# Patient Record
Sex: Male | Born: 1935 | Race: White | Hispanic: No | Marital: Married | Smoking: Current some day smoker
Health system: Southern US, Community
[De-identification: ages and names within clinical notes are randomized; demographics above are authoritative.]

## PROBLEM LIST (undated history)

## (undated) DIAGNOSIS — I6200 Nontraumatic subdural hemorrhage, unspecified: Secondary | ICD-10-CM

## (undated) DIAGNOSIS — M1990 Unspecified osteoarthritis, unspecified site: Secondary | ICD-10-CM

## (undated) DIAGNOSIS — K5790 Diverticulosis of intestine, part unspecified, without perforation or abscess without bleeding: Secondary | ICD-10-CM

## (undated) DIAGNOSIS — Z9889 Other specified postprocedural states: Secondary | ICD-10-CM

## (undated) DIAGNOSIS — IMO0002 Reserved for concepts with insufficient information to code with codable children: Secondary | ICD-10-CM

## (undated) DIAGNOSIS — E785 Hyperlipidemia, unspecified: Secondary | ICD-10-CM

## (undated) DIAGNOSIS — R400 Somnolence: Secondary | ICD-10-CM

## (undated) DIAGNOSIS — J449 Chronic obstructive pulmonary disease, unspecified: Secondary | ICD-10-CM

## (undated) DIAGNOSIS — H9190 Unspecified hearing loss, unspecified ear: Secondary | ICD-10-CM

## (undated) DIAGNOSIS — G2581 Restless legs syndrome: Secondary | ICD-10-CM

## (undated) HISTORY — DX: Restless legs syndrome: G25.81

## (undated) HISTORY — DX: Somnolence: R40.0

## (undated) HISTORY — PX: PARTIAL GASTRECTOMY: SHX2172

## (undated) HISTORY — DX: Chronic obstructive pulmonary disease, unspecified: J44.9

## (undated) HISTORY — DX: Other specified postprocedural states: Z98.890

## (undated) HISTORY — DX: Reserved for concepts with insufficient information to code with codable children: IMO0002

## (undated) HISTORY — PX: BRAIN SURGERY: SHX531

## (undated) HISTORY — PX: HERNIA REPAIR: SHX51

## (undated) HISTORY — PX: EYE SURGERY: SHX253

---

## 1994-12-24 DIAGNOSIS — Z9889 Other specified postprocedural states: Secondary | ICD-10-CM

## 1994-12-24 HISTORY — DX: Other specified postprocedural states: Z98.890

## 1994-12-24 HISTORY — PX: CARDIAC CATHETERIZATION: SHX172

## 1997-12-24 HISTORY — PX: BACK SURGERY: SHX140

## 2000-08-28 ENCOUNTER — Encounter: Payer: Self-pay | Admitting: *Deleted

## 2000-08-28 ENCOUNTER — Encounter: Admission: RE | Admit: 2000-08-28 | Discharge: 2000-08-28 | Payer: Self-pay | Admitting: *Deleted

## 2001-09-17 ENCOUNTER — Encounter: Admission: RE | Admit: 2001-09-17 | Discharge: 2001-09-17 | Payer: Self-pay | Admitting: Internal Medicine

## 2001-09-17 ENCOUNTER — Encounter (HOSPITAL_BASED_OUTPATIENT_CLINIC_OR_DEPARTMENT_OTHER): Payer: Self-pay | Admitting: Internal Medicine

## 2003-03-30 ENCOUNTER — Encounter: Payer: Self-pay | Admitting: General Surgery

## 2003-03-30 ENCOUNTER — Encounter: Admission: RE | Admit: 2003-03-30 | Discharge: 2003-03-30 | Payer: Self-pay | Admitting: General Surgery

## 2003-03-31 ENCOUNTER — Ambulatory Visit (HOSPITAL_BASED_OUTPATIENT_CLINIC_OR_DEPARTMENT_OTHER): Admission: RE | Admit: 2003-03-31 | Discharge: 2003-03-31 | Payer: Self-pay | Admitting: General Surgery

## 2003-04-04 ENCOUNTER — Observation Stay (HOSPITAL_COMMUNITY): Admission: EM | Admit: 2003-04-04 | Discharge: 2003-04-05 | Payer: Self-pay | Admitting: Emergency Medicine

## 2003-04-04 ENCOUNTER — Encounter: Payer: Self-pay | Admitting: Internal Medicine

## 2007-09-09 ENCOUNTER — Emergency Department (HOSPITAL_COMMUNITY): Admission: EM | Admit: 2007-09-09 | Discharge: 2007-09-09 | Payer: Self-pay | Admitting: Emergency Medicine

## 2008-12-19 ENCOUNTER — Observation Stay (HOSPITAL_COMMUNITY): Admission: EM | Admit: 2008-12-19 | Discharge: 2008-12-20 | Payer: Self-pay | Admitting: Emergency Medicine

## 2008-12-19 ENCOUNTER — Ambulatory Visit: Payer: Self-pay | Admitting: Cardiology

## 2008-12-20 ENCOUNTER — Encounter (INDEPENDENT_AMBULATORY_CARE_PROVIDER_SITE_OTHER): Payer: Self-pay | Admitting: General Surgery

## 2008-12-28 ENCOUNTER — Ambulatory Visit: Payer: Self-pay

## 2009-01-04 ENCOUNTER — Ambulatory Visit: Payer: Self-pay | Admitting: Cardiology

## 2009-01-05 DIAGNOSIS — I4892 Unspecified atrial flutter: Secondary | ICD-10-CM | POA: Insufficient documentation

## 2009-01-05 DIAGNOSIS — J449 Chronic obstructive pulmonary disease, unspecified: Secondary | ICD-10-CM

## 2009-01-05 DIAGNOSIS — J4489 Other specified chronic obstructive pulmonary disease: Secondary | ICD-10-CM | POA: Insufficient documentation

## 2009-01-05 DIAGNOSIS — R079 Chest pain, unspecified: Secondary | ICD-10-CM

## 2009-01-05 DIAGNOSIS — I517 Cardiomegaly: Secondary | ICD-10-CM

## 2009-11-13 ENCOUNTER — Inpatient Hospital Stay (HOSPITAL_COMMUNITY): Admission: EM | Admit: 2009-11-13 | Discharge: 2009-11-17 | Payer: Self-pay | Admitting: Neurology

## 2009-11-13 ENCOUNTER — Ambulatory Visit: Payer: Self-pay | Admitting: Cardiovascular Disease

## 2009-11-16 ENCOUNTER — Encounter: Payer: Self-pay | Admitting: Cardiovascular Disease

## 2009-11-21 ENCOUNTER — Ambulatory Visit: Payer: Self-pay | Admitting: Cardiology

## 2009-11-21 ENCOUNTER — Encounter (INDEPENDENT_AMBULATORY_CARE_PROVIDER_SITE_OTHER): Payer: Self-pay | Admitting: Cardiology

## 2009-11-21 LAB — CONVERTED CEMR LAB: POC INR: 3.5

## 2009-11-28 ENCOUNTER — Ambulatory Visit: Payer: Self-pay | Admitting: Cardiology

## 2009-12-05 ENCOUNTER — Ambulatory Visit: Payer: Self-pay | Admitting: Cardiovascular Disease

## 2009-12-09 ENCOUNTER — Ambulatory Visit: Payer: Self-pay | Admitting: Cardiology

## 2009-12-13 ENCOUNTER — Ambulatory Visit: Payer: Self-pay | Admitting: Internal Medicine

## 2009-12-19 ENCOUNTER — Telehealth (INDEPENDENT_AMBULATORY_CARE_PROVIDER_SITE_OTHER): Payer: Self-pay | Admitting: Physician Assistant

## 2009-12-21 ENCOUNTER — Telehealth: Payer: Self-pay | Admitting: Cardiology

## 2009-12-22 ENCOUNTER — Ambulatory Visit: Payer: Self-pay | Admitting: Internal Medicine

## 2010-01-06 ENCOUNTER — Encounter: Payer: Self-pay | Admitting: Cardiology

## 2010-01-09 ENCOUNTER — Ambulatory Visit: Payer: Self-pay | Admitting: Cardiology

## 2010-01-10 ENCOUNTER — Ambulatory Visit: Payer: Self-pay | Admitting: Cardiology

## 2010-01-30 ENCOUNTER — Ambulatory Visit: Payer: Self-pay | Admitting: Cardiovascular Disease

## 2010-01-30 LAB — CONVERTED CEMR LAB: POC INR: 2.7

## 2010-02-27 ENCOUNTER — Ambulatory Visit: Payer: Self-pay | Admitting: Internal Medicine

## 2010-03-27 ENCOUNTER — Ambulatory Visit: Payer: Self-pay | Admitting: Cardiovascular Disease

## 2010-03-27 LAB — CONVERTED CEMR LAB: POC INR: 3

## 2010-04-24 ENCOUNTER — Ambulatory Visit: Payer: Self-pay | Admitting: Cardiology

## 2010-05-16 ENCOUNTER — Telehealth (INDEPENDENT_AMBULATORY_CARE_PROVIDER_SITE_OTHER): Payer: Self-pay | Admitting: Pharmacist

## 2010-05-23 ENCOUNTER — Ambulatory Visit: Payer: Self-pay | Admitting: Internal Medicine

## 2010-06-15 ENCOUNTER — Telehealth: Payer: Self-pay | Admitting: Cardiology

## 2010-06-20 ENCOUNTER — Ambulatory Visit: Payer: Self-pay | Admitting: Internal Medicine

## 2010-07-11 ENCOUNTER — Ambulatory Visit: Payer: Self-pay

## 2010-07-11 ENCOUNTER — Ambulatory Visit: Payer: Self-pay | Admitting: Internal Medicine

## 2010-07-11 ENCOUNTER — Ambulatory Visit: Payer: Self-pay | Admitting: Cardiology

## 2010-07-11 DIAGNOSIS — I959 Hypotension, unspecified: Secondary | ICD-10-CM | POA: Insufficient documentation

## 2010-07-11 DIAGNOSIS — R609 Edema, unspecified: Secondary | ICD-10-CM

## 2010-07-11 LAB — CONVERTED CEMR LAB: POC INR: 2.6

## 2010-07-14 ENCOUNTER — Telehealth: Payer: Self-pay | Admitting: Cardiology

## 2010-07-21 ENCOUNTER — Telehealth: Payer: Self-pay | Admitting: Cardiology

## 2010-07-24 ENCOUNTER — Encounter (INDEPENDENT_AMBULATORY_CARE_PROVIDER_SITE_OTHER): Payer: Self-pay | Admitting: *Deleted

## 2010-07-27 ENCOUNTER — Ambulatory Visit: Payer: Self-pay | Admitting: Cardiovascular Disease

## 2010-07-27 LAB — CONVERTED CEMR LAB: POC INR: 2.5

## 2010-08-22 ENCOUNTER — Telehealth: Payer: Self-pay | Admitting: Cardiology

## 2010-08-23 ENCOUNTER — Telehealth (INDEPENDENT_AMBULATORY_CARE_PROVIDER_SITE_OTHER): Payer: Self-pay | Admitting: *Deleted

## 2010-08-30 ENCOUNTER — Ambulatory Visit: Payer: Self-pay | Admitting: Cardiovascular Disease

## 2010-08-30 LAB — CONVERTED CEMR LAB: POC INR: 4

## 2010-09-06 ENCOUNTER — Ambulatory Visit: Payer: Self-pay | Admitting: Internal Medicine

## 2010-09-06 LAB — CONVERTED CEMR LAB: POC INR: 2.6

## 2010-09-29 ENCOUNTER — Ambulatory Visit: Payer: Self-pay | Admitting: Cardiology

## 2010-09-29 LAB — CONVERTED CEMR LAB: POC INR: 3.3

## 2010-10-17 ENCOUNTER — Ambulatory Visit: Payer: Self-pay | Admitting: Cardiology

## 2010-11-02 ENCOUNTER — Ambulatory Visit: Payer: Self-pay | Admitting: Cardiology

## 2010-11-17 ENCOUNTER — Ambulatory Visit: Payer: Self-pay | Admitting: Cardiology

## 2010-12-07 ENCOUNTER — Ambulatory Visit: Payer: Self-pay | Admitting: Internal Medicine

## 2010-12-24 DIAGNOSIS — I6200 Nontraumatic subdural hemorrhage, unspecified: Secondary | ICD-10-CM

## 2010-12-24 HISTORY — PX: OTHER SURGICAL HISTORY: SHX169

## 2010-12-24 HISTORY — DX: Nontraumatic subdural hemorrhage, unspecified: I62.00

## 2010-12-28 ENCOUNTER — Ambulatory Visit
Admission: RE | Admit: 2010-12-28 | Discharge: 2010-12-28 | Payer: Self-pay | Source: Home / Self Care | Attending: Cardiology | Admitting: Cardiology

## 2010-12-28 ENCOUNTER — Ambulatory Visit: Admission: RE | Admit: 2010-12-28 | Discharge: 2010-12-28 | Payer: Self-pay | Source: Home / Self Care

## 2010-12-28 ENCOUNTER — Encounter: Payer: Self-pay | Admitting: Cardiology

## 2011-01-24 ENCOUNTER — Encounter (INDEPENDENT_AMBULATORY_CARE_PROVIDER_SITE_OTHER): Payer: Medicare PPO

## 2011-01-24 ENCOUNTER — Encounter: Payer: Self-pay | Admitting: Internal Medicine

## 2011-01-24 ENCOUNTER — Ambulatory Visit: Admit: 2011-01-24 | Payer: Self-pay

## 2011-01-24 DIAGNOSIS — I4891 Unspecified atrial fibrillation: Secondary | ICD-10-CM

## 2011-01-24 DIAGNOSIS — Z7901 Long term (current) use of anticoagulants: Secondary | ICD-10-CM

## 2011-01-25 NOTE — Medication Information (Signed)
Summary: rov/tm   Anticoagulant Therapy  Managed by: Weston Brass, PharmD Referring MD: Shirlee Latch MD, Freida Busman PCP: Jarome Matin Supervising MD: Antoine Poche MD, Fayrene Fearing Indication 1: Atrial Fibrillation Indication 2: Atrial Flutter Lab Used: [facility] [place] Site: [address] INR POC 1.6 INR RANGE 2-3  Dietary changes: no    Health status changes: no    Bleeding/hemorrhagic complications: no    Recent/future hospitalizations: no    Any changes in medication regimen? no    Recent/future dental: no  Any missed doses?: yes     Details: held Coumadin for cataract surgery on 11/1.  Missed 1 dose since procedure this week  Is patient compliant with meds? yes       Allergies: 1)  ! Penicillin 2)  ! Augmentin  Anticoagulation Management History:      The patient is taking warfarin and comes in today for a routine follow up visit.  Positive risk factors for bleeding include an age of 75 years or older.  The bleeding index is 'intermediate risk'.  Positive CHADS2 values include Age > 75 years old.  Anticoagulation responsible provider: Antoine Poche MD, Fayrene Fearing.  INR POC: 1.6.  Cuvette Lot#: [id number].  Exp: 11/2011.    Anticoagulation Management Assessment/Plan:      The patient's current anticoagulation dose is Warfarin sodium 5 mg tabs: Use as directed by Anticoagulation Clinic.  The target INR is 2.0-3.0.  The next INR is due 11/17/2010.  Anticoagulation instructions were given to patient/spouse.  Results were reviewed/authorized by Weston Brass, PharmD.  He was notified by Weston Brass PharmD.         Current Anticoagulation Instructions: INR 1.6  Take 1 tablet today and tomorrow then resume same dose of 1/2 tablet every day except 1 tablet on Monday.  Recheck INR in 2 weeks.

## 2011-01-25 NOTE — Medication Information (Signed)
Summary: ROV/LB      Allergies Added:  Anticoagulant Therapy  Managed by: Jeralene Peters, PharmD Referring MD: Shirlee Latch MD, Dalton PCP: Jarome Matin Supervising MD: Excell Seltzer MD, Casimiro Needle Indication 1: Atrial Fibrillation Indication 2: Atrial Flutter [place] Site: [address] INR POC 2.7 INR RANGE 2-3  Dietary changes: no    Health status changes: no    Bleeding/hemorrhagic complications: no    Recent/future hospitalizations: no    Any changes in medication regimen? no    Recent/future dental: no  Any missed doses?: no       Is patient compliant with meds? yes       Current Medications (verified): 1)  Diltiazem Hcl Er Beads 360 Mg Xr24h-Cap (Diltiazem Hcl Er Beads) .... Take One Capsule By Mouth Daily 2)  Warfarin Sodium 5 Mg Tabs (Warfarin Sodium) .... Use As Directed By Anticoagulation Clinic 3)  Spiriva Handihaler 18 Mcg Caps (Tiotropium Bromide Monohydrate) .... Inhale 1 Capsule Daily. 4)  Toprol Xl 25 Mg Xr24h-Tab (Metoprolol Succinate) .... One Tablet Twice A Day 5)  Omeprazole 20 Mg Cpdr (Omeprazole) .... Take One Capsule By Mouth Daily  Allergies (verified): 1)  ! Penicillin 2)  ! Augmentin  Anticoagulation Management History:      The patient is taking warfarin and comes in today for a routine follow up visit.  Positive risk factors for bleeding include an age of 75 years or older.  The bleeding index is 'intermediate risk'.  Positive CHADS2 values include Age > 1 years old.  Anticoagulation responsible provider: Excell Seltzer MD, Casimiro Needle.  INR POC: 2.7.  Cuvette Lot#: [id number].  Exp: 03/2011.    Anticoagulation Management Assessment/Plan:      The patient's current anticoagulation dose is Warfarin sodium 5 mg tabs: Use as directed by Anticoagulation Clinic.  The target INR is 2.0-3.0.  The next INR is due 02/27/2010.  Results were reviewed/authorized by Jeralene Peters, PharmD.         Prior Anticoagulation Instructions: INR 3  CONTINUE TAKING 0.5 TABLETS EVERYDAY  EXCEPT TAKE 1 TABLET ON MONDAYS AND FRIDAYS. RECHECK IN 3 WEEKS.  Current Anticoagulation Instructions: INR 2.7  Continue taking 1/2 tablet daily except  take 1 tablet  on Mondays and Fridays.  Recheck in 4 weeks.

## 2011-01-25 NOTE — Medication Information (Signed)
Summary: rov/sp   Anticoagulant Therapy  Managed by: Weston Brass, PharmD Referring MD: Shirlee Latch MD, Freida Busman PCP: Gwenette Greet MD: Myrtis Ser MD, Tinnie Gens Indication 1: Atrial Fibrillation Indication 2: Atrial Flutter Lab Used: [facility] [place] Site: [address] INR POC 2.6 INR RANGE 2-3  Dietary changes: no    Health status changes: no    Bleeding/hemorrhagic complications: no    Recent/future hospitalizations: no    Any changes in medication regimen? no    Recent/future dental: no  Any missed doses?: no       Is patient compliant with meds? yes       Allergies: 1)  ! Penicillin 2)  ! Augmentin  Anticoagulation Management History:      The patient is taking warfarin and comes in today for a routine follow up visit.  Positive risk factors for bleeding include an age of 75 years or older.  The bleeding index is 'intermediate risk'.  Positive CHADS2 values include Age > 10 years old.  Anticoagulation responsible provider: Myrtis Ser MD, Tinnie Gens.  INR POC: 2.6.  Cuvette Lot#: [id number].  Exp: 11/2011.    Anticoagulation Management Assessment/Plan:      The patient's current anticoagulation dose is Warfarin sodium 5 mg tabs: Use as directed by Anticoagulation Clinic.  The target INR is 2.0-3.0.  The next INR is due 12/08/2010.  Anticoagulation instructions were given to patient/spouse.  Results were reviewed/authorized by Weston Brass, PharmD.  He was notified by Weston Brass PharmD.         Prior Anticoagulation Instructions: INR 1.6  Take 1 tablet today and tomorrow then resume same dose of 1/2 tablet every day except 1 tablet on Monday.  Recheck INR in 2 weeks.   Current Anticoagulation Instructions: INR 2.6  Continue same dose of 1/2 tablet every day except 1 tablet on Monday.  Recheck INR in 3 weeks.

## 2011-01-25 NOTE — Progress Notes (Signed)
  Phone Note Call from Patient   Caller: Spouse Summary of Call: Pt's wife called because he accidentally took 1 tablet last PM. Normal schedule is Coumadin 1/2 tablet daily except 1 tablet on Mon and Fri. Instructed pt's wife to give him 1/2 tablet today and tomorrow, and then return to normal schedule. Pt's wife understood plan. Initial call taken by: Elaina Pattee, PharmD,  June 15, 2010 9:41 AM

## 2011-01-25 NOTE — Assessment & Plan Note (Signed)
Summary: per check out/sf      Allergies Added:   Visit Type:  Follow-up Primary Provider:  Jarome Matin  CC:  no complaints.  History of Present Illness: 77 yo with h/o COPD and atypical atrial flutter returns for followup.  In 11/10, he underwent TEE-guided DCCV successfully.  Unfortunately, he went back into atrial flutter and remains in atrial flutter today.    He has been doing well in general.  No chest pain or exertional dyspnea.  No syncope.  He works in the yard (mows grass, has a garden).  He rarely smokes a cigarette. Weight is stable.    ECG: Atypical atrial flutter with rate 72, RBBB, LAFB  Labs (11/10): creatinine 1.11, K 3.8  Current Medications (verified): 1)  Diltiazem Hcl Er Beads 180 Mg Xr24h-Cap (Diltiazem Hcl Er Beads) .... One By Mouth Daily 2)  Warfarin Sodium 5 Mg Tabs (Warfarin Sodium) .... Use As Directed By Anticoagulation Clinic 3)  Toprol Xl 25 Mg Xr24h-Tab (Metoprolol Succinate) .... One Tablet Twice A Day  Allergies (verified): 1)  ! Penicillin 2)  ! Augmentin  Past History:  Past Medical History: Reviewed history from 12/09/2009 and no changes required. 1.  Episode of syncope in 2004 thought to be vasovagal. 2. Left heart catheterization in 1996 done by Dr. [place] was negative per report.  Adenosine Myoview in January 2010:  EF 64%, normal wall motion, mild inferobasilar fixed defect that was likely attenuation, no scar or ischemia.  3.  COPD.  Quit smoking 11/10.  4.  Peptic ulcer disease. 5.  History of restless legs syndrome. 6.  Atypical atrial flutter, this is paroxysmal.  Admission in 11/10 with atrial flutter/RVR, patient underwent TEE-guided DCCV but flutter recurred by 12/10.  7.  Echo (11/10): EF 70%, moderate LVH, mild MR, no regional wall motion abnormalities.   Family History: Reviewed history from 01/05/2009 and no changes required. The patient's father has a history of stroke.  The patient's brother had coronary artery  disease.  Social History: Reviewed history from 07/11/2010 and no changes required. The patient lives in [place] with his wife.  He is retired from the [employer].  He has a greater than 60-pack-year history of smoking, quit 11/10.  He denies drinking alcohol.  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  Vital Signs:  Patient profile:   77 year old male Height:      70 inches Weight:      138.50 pounds BMI:     19.94 Pulse rate:   71 / minute BP sitting:   107 / 63  (left arm) Cuff size:   regular  Vitals Entered By: Caralee Ates CMA (December 28, 2010 3:16 PM)  Physical Exam  General:  Well developed, well nourished, in no acute distress. Neck:  Neck supple, no JVD. No masses, thyromegaly or abnormal cervical nodes. Lungs:  Clear bilaterally to auscultation and percussion. Heart:  Non-displaced PMI, chest non-tender; irregular rate and rhythm, S1, S2 without rubs or gallops. 1/6 systolic murmur along the upper sternal border.  Carotid upstroke normal, no bruit.  Pedals normal pulses. No edema.  Abdomen:  Bowel sounds positive; abdomen soft and non-tender without masses, organomegaly, or hernias noted. No hepatosplenomegaly. Extremities:  No clubbing or cyanosis. Neurologic:  Alert and oriented x 3. Psych:  Normal affect.   Impression & Recommendations:  Problem # 1:  ATRIAL FLUTTER (ICD-427.32) Atypical flutter.  This recurred post-cardioversion in 11/10.  Rate is good on current regimen.  Continue coumadin (no falls, steady  on feet) as his CHADSVASC score is 2 and he has a moderate stroke risk.  Followup in 1 year.   Patient Instructions: 1)  Your physician wants you to follow-up in: 1 year with Dr Shirlee Latch.Jake Shark 2013)  You will receive a reminder letter in the mail two months in advance. If you don't receive a letter, please call our office to schedule the follow-up appointment.

## 2011-01-25 NOTE — Medication Information (Signed)
Summary: rov/cs  Anticoagulant Therapy  Managed by: Bethena Midget, RN, BSN Referring MD: Shirlee Latch MD, Makayela Secrest PCP: Gwenette Greet MD: Shirlee Latch MD, Nickalos Petersen Indication 1: Atrial Fibrillation Indication 2: Atrial Flutter Lab Used: [facility] [place] Site: [address] INR POC 3.1 INR RANGE 2-3  Dietary changes: no    Health status changes: no    Bleeding/hemorrhagic complications: no    Recent/future hospitalizations: yes       Details: Pending Left eye cataract removal on 10/24/10  Any changes in medication regimen? no    Recent/future dental: no  Any missed doses?: no       Is patient compliant with meds? yes       Allergies: 1)  ! Penicillin 2)  ! Augmentin  Anticoagulation Management History:      The patient is taking warfarin and comes in today for a routine follow up visit.  Positive risk factors for bleeding include an age of 75 years or older.  The bleeding index is 'intermediate risk'.  Positive CHADS2 values include Age > 6 years old.  Anticoagulation responsible provider: Shirlee Latch MD, Melecio Cueto.  INR POC: 3.1.  Cuvette Lot#: [id number].  Exp: 11/2011.    Anticoagulation Management Assessment/Plan:      The patient's current anticoagulation dose is Warfarin sodium 5 mg tabs: Use as directed by Anticoagulation Clinic.  The target INR is 2.0-3.0.  The next INR is due 11/02/2010.  Anticoagulation instructions were given to patient/spouse.  Results were reviewed/authorized by Bethena Midget, RN, BSN.  He was notified by Bethena Midget, RN, BSN.         Prior Anticoagulation Instructions: INR 3.3  Take 1/2 tablet today. Then take 1/2 tablet every day of the week, except 1 tablet on Monday.  Return to clinic on Tuesday, October 17, 2010.    Current Anticoagulation Instructions: INR 3.1 Take last dose this Friday  Oct 28th per Pharm D.  Then resume after procedure per Dr Vic Blackbird instructions. Resume 1/2 pill everyday except 1 pill on Mondays.    Appended Document: rov/cs Per Dr Shirlee Latch pt is ok to hold coumadin for 3 days prior to Cataract removal.

## 2011-01-25 NOTE — Progress Notes (Signed)
Summary: B/P readings   Phone Note Outgoing Call   Call placed by: Katina Dung, RN, BSN,  July 21, 2010 2:08 PM Call placed to: Patient Summary of Call: B/P readings  Follow-up for Phone Call        talked with pt--left message for wife  to call me back with B/P readings-Anne Lankford,RN  talked with wife 07/12/10  94/51 07/13/10  97/58 07/14/10  88/51 07/15/10 98/62 07/16/10 92/54 07/17/10  07/18/10 90/57 07/19/10 104/61 07/20/10 102/62 07/21/10 117/67  wife states pt feels good--no dizziness/no lightheadedness--cataract surgery 07/18/10--she will continue to monitor pt's B/P --will forward to Dr Shirlee Latch for review       Appended Document: B/P readings Decrease diltiazem CD to 180 mg daily  Appended Document: B/P readings wife aware.  new doseage called in

## 2011-01-25 NOTE — Medication Information (Signed)
Summary: rov/kb  Anticoagulant Therapy  Managed by: Weston Brass, PharmD Referring MD: Shirlee Latch MD, Freida Busman PCP: Jarome Matin Supervising MD: Gala Romney MD, Reuel Boom Indication 1: Atrial Fibrillation Indication 2: Atrial Flutter Lab Used: [facility] [place] Site: [address] INR POC 3.0 INR RANGE 2-3  Dietary changes: no    Health status changes: no    Bleeding/hemorrhagic complications: no    Recent/future hospitalizations: no    Any changes in medication regimen? no    Recent/future dental: no  Any missed doses?: yes     Details: missed 1 dose but took extra 1/2 tablet the next day  Is patient compliant with meds? yes       Allergies: 1)  ! Penicillin 2)  ! Augmentin  Anticoagulation Management History:      The patient is taking warfarin and comes in today for a routine follow up visit.  Positive risk factors for bleeding include an age of 75 years or older.  The bleeding index is 'intermediate risk'.  Positive CHADS2 values include Age > 68 years old.  Anticoagulation responsible provider: Ludmilla Mcgillis MD, Reuel Boom.  INR POC: 3.0.  Cuvette Lot#: [id number].  Exp: 07/2011.    Anticoagulation Management Assessment/Plan:      The patient's current anticoagulation dose is Warfarin sodium 5 mg tabs: Use as directed by Anticoagulation Clinic.  The target INR is 2.0-3.0.  The next INR is due 07/18/2010.  Anticoagulation instructions were given to patient.  Results were reviewed/authorized by Weston Brass, PharmD.  He was notified by Weston Brass PharmD.         Prior Anticoagulation Instructions: INR-2.9 Resume normal dosing schedule. Take 1 tablet on Monday and Fridayand take half a tablet on all other days. Return in 4 weeks  Current Anticoagulation Instructions: INR 3.0  Continue same dose of 1/2 tablet every day except 1 tablet on Monday and Friday.

## 2011-01-25 NOTE — Medication Information (Signed)
Summary: rov/jm   Anticoagulant Therapy  Managed by: Weston Brass, PharmD Referring MD: Shirlee Latch MD, Freida Busman PCP: Jarome Matin Supervising MD: Johney Frame MD, Fayrene Fearing Indication 1: Atrial Fibrillation Indication 2: Atrial Flutter Lab Used: [facility] [place] Site: [address] INR POC 2.6 INR RANGE 2-3  Dietary changes: no    Health status changes: no    Bleeding/hemorrhagic complications: no    Recent/future hospitalizations: no    Any changes in medication regimen? no    Recent/future dental: no  Any missed doses?: no       Is patient compliant with meds? yes       Allergies: 1)  ! Penicillin 2)  ! Augmentin  Anticoagulation Management History:      The patient is taking warfarin and comes in today for a routine follow up visit.  Positive risk factors for bleeding include an age of 75 years or older.  The bleeding index is 'intermediate risk'.  Positive CHADS2 values include Age > 75 years old.  Anticoagulation responsible provider: Allred MD, Fayrene Fearing.  INR POC: 2.6.  Cuvette Lot#: [id number].  Exp: 09/2011.    Anticoagulation Management Assessment/Plan:      The patient's current anticoagulation dose is Warfarin sodium 5 mg tabs: Use as directed by Anticoagulation Clinic.  The target INR is 2.0-3.0.  The next INR is due 09/29/2010.  Anticoagulation instructions were given to patient.  Results were reviewed/authorized by Weston Brass, PharmD.  He was notified by Harrel Carina, PharmD candidate.         Prior Anticoagulation Instructions: INR 4.0  Do not take today's dose of Coumadin.  Take one-half tablet tomorrow (Thursday), one-half tablet on Friday, then resume one-half tablet every day except one tablet on Monday and Friday.  We will see you in 1 week.    Current Anticoagulation Instructions: INR 2.6  Continue taking 1/2 tablet everyday except take 1 tablet on Mondays and Fridays. Re-check INR in 3 weeks.

## 2011-01-25 NOTE — Progress Notes (Signed)
  Phone Note Outgoing Call   Call placed by: Bethena Midget, RN, BSN,  August 23, 2010 2:35 PM Details for Reason: Follow up  Summary of Call: Telephoned spouse and she states that Dr Shirlee Latch called them this AM. After Dr Shirlee Latch talked to the pt, spouse states he agreed to take coumadin.  Reminder her of his follow up with [place] next week.  Initial call taken by: Bethena Midget, RN, BSN,  August 23, 2010 2:46 PM

## 2011-01-25 NOTE — Medication Information (Signed)
Summary: rov/sp  Anticoagulant Therapy  Managed by: Weston Brass, PharmD Referring MD: Shirlee Latch MD, Freida Busman PCP: Gwenette Greet MD: Graciela Husbands MD, Viviann Spare Indication 1: Atrial Fibrillation Indication 2: Atrial Flutter Lab Used: [facility] [place] Site: [address] INR POC 2.6 INR RANGE 2-3  Dietary changes: no    Health status changes: no    Bleeding/hemorrhagic complications: no    Recent/future hospitalizations: yes       Details: going to eye doctor tomorrow   Any changes in medication regimen? no    Recent/future dental: no  Any missed doses?: no       Is patient compliant with meds? yes       Allergies: 1)  ! Penicillin 2)  ! Augmentin  Anticoagulation Management History:      The patient is taking warfarin and comes in today for a routine follow up visit.  Positive risk factors for bleeding include an age of 75 years or older.  The bleeding index is 'intermediate risk'.  Positive CHADS2 values include Age > 75 years old.  Anticoagulation responsible provider: Graciela Husbands MD, Viviann Spare.  INR POC: 2.6.  Cuvette Lot#: [id number].  Exp: 09/2011.    Anticoagulation Management Assessment/Plan:      The patient's current anticoagulation dose is Warfarin sodium 5 mg tabs: Use as directed by Anticoagulation Clinic.  The target INR is 2.0-3.0.  The next INR is due 08/08/2010.  Anticoagulation instructions were given to patient.  Results were reviewed/authorized by Weston Brass, PharmD.  He was notified by Dillard Cannon.         Prior Anticoagulation Instructions: INR 3.0  Continue same dose of 1/2 tablet every day except 1 tablet on Monday and Friday.   Current Anticoagulation Instructions: INR 2.6  Continue same dose of 1/2 tablet daily except 1 tablet on Monday and Friday.  Re-check INR in 4 weeks.

## 2011-01-25 NOTE — Medication Information (Signed)
Summary: rov/ewj  Anticoagulant Therapy  Managed by: Eda Keys, PharmD Referring MD: Shirlee Latch MD, Freida Busman PCP: Gwenette Greet MD: Clifton James MD, Cristal Deer Indication 1: Atrial Fibrillation Indication 2: Atrial Flutter Lab Used: [facility] [place] Site: [address] INR POC 4.0 INR RANGE 2-3  Dietary changes: no    Health status changes: no    Bleeding/hemorrhagic complications: no    Recent/future hospitalizations: no    Any changes in medication regimen? no    Recent/future dental: no  Any missed doses?: no       Is patient compliant with meds? yes       Allergies: 1)  ! Penicillin 2)  ! Augmentin  Anticoagulation Management History:      The patient is taking warfarin and comes in today for a routine follow up visit.  Positive risk factors for bleeding include an age of 75 years or older.  The bleeding index is 'intermediate risk'.  Positive CHADS2 values include Age > 87 years old.  Anticoagulation responsible provider: Clifton James MD, Cristal Deer.  INR POC: 4.0.  Cuvette Lot#: [id number].  Exp: 09/2011.    Anticoagulation Management Assessment/Plan:      The patient's current anticoagulation dose is Warfarin sodium 5 mg tabs: Use as directed by Anticoagulation Clinic.  The target INR is 2.0-3.0.  The next INR is due 09/06/2010.  Anticoagulation instructions were given to patient.  Results were reviewed/authorized by Eda Keys, PharmD.  He was notified by Kennieth Francois.         Prior Anticoagulation Instructions: INR 2.5  Continue on same dosage 1/2 tablet daily except 1 tablet on Mondays and Fridays.  Recheck in 4 weeks.    Current Anticoagulation Instructions: INR 4.0  Do not take today's dose of Coumadin.  Take one-half tablet tomorrow (Thursday), one-half tablet on Friday, then resume one-half tablet every day except one tablet on Monday and Friday.  We will see you in 1 week.

## 2011-01-25 NOTE — Progress Notes (Signed)
Summary: Refusing to take Coumadin  Phone Note Call from Patient   Caller: Spouse Call For: Coumadin Clinic Summary of Call: Pt's wife called states pt refused to take his Coumadin last night and states "he is tired of taking all of these pills" and he is no longer going to take his Coumadin.  Explained the importance of taking Coumadin and the incr risks associated with blood clots, ie stroke, MI, PE, DVT, and death if pt does not take the Coumadin.  Pt's wife states she understands risks and will try to persude her husband to resume Coumadin.  Advised to call back if pt refuses to resume so we can let his cardiologist know he has discontinued.  She agrees.  Pt's current Coumadin dosage is 1/2 tablet daily except 1 tablet on Mondays and Fridays.  Advised to have pt take 1 tablet today if he decides to resume, then continue as previously rx.  Initial call taken by: Cloyde Reams RN,  August 22, 2010 12:07 PM     Appended Document: Refusing to take Coumadin Would prefer he take coumadin if possible due to stroke risk with persistent atrial flutter.  Pradaxa would be an option as well though I tend to prefer coumadin in more elderly patients (reversible).  If he refuses to take either, please make sure that he will take ASA 325 mg daily.

## 2011-01-25 NOTE — Medication Information (Signed)
Summary: rov/eac  Anticoagulant Therapy  Managed by: Weston Brass, PharmD Referring MD: Shirlee Latch MD, Freida Busman PCP: Gwenette Greet MD: Ladona Ridgel MD, Sharlot Gowda Indication 1: Atrial Fibrillation Indication 2: Atrial Flutter Lab Used: [facility] [place] Site: [address] INR POC 2.9 INR RANGE 2-3  Dietary changes: no    Health status changes: no    Bleeding/hemorrhagic complications: no    Recent/future hospitalizations: no    Any changes in medication regimen? no    Recent/future dental: no  Any missed doses?: yes     Details: 5.23..11 missed dose and then took a whole tablet next day instead of one tablet  Is patient compliant with meds? yes       Allergies: 1)  ! Penicillin 2)  ! Augmentin  Anticoagulation Management History:      The patient is taking warfarin and comes in today for a routine follow up visit.  Positive risk factors for bleeding include an age of 4 years or older.  The bleeding index is 'intermediate risk'.  Positive CHADS2 values include Age > 9 years old.  Anticoagulation responsible provider: Ladona Ridgel MD, Sharlot Gowda.  INR POC: 2.9.  Cuvette Lot#: [id number].  Exp: 07/2011.    Anticoagulation Management Assessment/Plan:      The patient's current anticoagulation dose is Warfarin sodium 5 mg tabs: Use as directed by Anticoagulation Clinic.  The target INR is 2.0-3.0.  The next INR is due 06/20/2010.  Anticoagulation instructions were given to patient.  Results were reviewed/authorized by Weston Brass, PharmD.  He was notified by [facility].         Prior Anticoagulation Instructions: INR 2.8  Continue taking 1 tablet on MOnday and Friday and 1/2 tablet all other days.  Return to clinic in 4 weeks.    Current Anticoagulation Instructions: INR-2.9 Resume normal dosing schedule. Take 1 tablet on Monday and Fridayand take half a tablet on all other days. Return in 4 weeks

## 2011-01-25 NOTE — Miscellaneous (Signed)
Summary: decrease Ditiazem 180mg   Clinical Lists Changes  Medications: Changed medication from DILTIAZEM HCL CR 240 MG XR24H-CAP (DILTIAZEM HCL) one daily to DILTIAZEM HCL ER BEADS 180 MG XR24H-CAP (DILTIAZEM HCL ER BEADS) one by mouth daily - Signed Rx of DILTIAZEM HCL ER BEADS 180 MG XR24H-CAP (DILTIAZEM HCL ER BEADS) one by mouth daily;  #30 x 11;  Signed;  Entered by: Dennis Bast, RN, BSN;  Authorized by: Marca Ancona, MD;  Method used: [facility] [address] [phone]*, [address] [place], [place], [place]  [address], Ph: [phone] or [phone], Fax: [phone]    Prescriptions: DILTIAZEM HCL ER BEADS 180 MG XR24H-CAP (DILTIAZEM HCL ER BEADS) one by mouth daily  #30 x 11   Entered by:   Dennis Bast, RN, BSN   Authorized by:   Marca Ancona, MD   Signed by:   Dennis Bast, RN, BSN on 07/24/2010   Method used:   [facility]ronically to        [facility] [address] [phone]* (retail)       [address]       [place], [place]  [address]       Ph: [phone] or [phone]       Fax: [phone]   RxID:   [id number]

## 2011-01-25 NOTE — Progress Notes (Signed)
Summary: catarac surgery 7-26/**PH rings no answer/nm   Phone Note Call from Patient   Caller: Patient Reason for Call: Talk to Nurse Summary of Call: pt's catarac surgery scheduled for 7-26-was here 7-19 was told there should be no problem with having the surgery, but wife just wanted to confirm that since they didn't see the eye dr until the day after he was seen here and they didn't know if he would actually have surgery or that it would be so soon if any problem can call [phone] Initial call taken by: Glynda Jaeger,  July 14, 2010 4:32 PM  Follow-up for Phone Call        Attemted to call pt. phone ringed several time no answer. Ollen Gross, RN, BSN  July 14, 2010 5:02 PM      Appended Document: catarac surgery 7-26/**PH rings no answer/nm ok to stop coumadin for cataract surgery (5 days prior) and restart after.  Appended Document: catarac surgery 7-26/**PH rings no answer/nm PT AWARE  COUMADIN HELD X3 DAYS  PRIOR TO PROCEDURE  MD PERFORMING   ONLY REQUESTED 3 DAYS AND TO RESTART PER THEIR INSTRUCTION./CY

## 2011-01-25 NOTE — Assessment & Plan Note (Signed)
Summary: 1 mo f/u      Allergies Added:   Primary Provider:  Jarome Matin  CC:  1 month follow up. Pt has questions on how long he will have to be on Coumadin.   His pulse rates have been between 52-66 at home per Anne's request for him to check it.Trevor Brooks  History of Present Illness: 77 Brooks with h/o COPD and atypical atrial flutter returns for followup after recent hospitalization with atrial flutter and rapid ventricular response in 11/10.  While in the hospital, he underwent TEE-guided DCCV successfully.  Unfortunately, he went back into atrial flutter.    He has atypical atrial flutter today with rate 70.  His wife has been checking BP and pulse at home and says his pulse has run 52-66.  He has been doing well in general.  No chest pain or exertional dyspnea.  No syncope.  He has not been as active as usual due to the weather.  He is maintaining abstinence from cigarettes.   ECG: Atypical atrial flutter with rate 68, RBBB  Labs (11/10): creatinine 1.11, K 3.8  Current Medications (verified): 1)  Diltiazem Hcl Er Beads 360 Mg Xr24h-Cap (Diltiazem Hcl Er Beads) .... Take One Capsule By Mouth Daily 2)  Warfarin Sodium 5 Mg Tabs (Warfarin Sodium) .... Use As Directed By Anticoagulation Clinic 3)  Spiriva Handihaler 18 Mcg Caps (Tiotropium Bromide Monohydrate) .... Inhale 1 Capsule Daily. 4)  Toprol Xl 25 Mg Xr24h-Tab (Metoprolol Succinate) .... One Tablet Twice A Day 5)  Omeprazole 20 Mg Cpdr (Omeprazole) .... Take One Capsule By Mouth Daily  Allergies (verified): 1)  ! Penicillin 2)  ! Augmentin  Past History:  Past Medical History: Last updated: 12/09/2009 1.  Episode of syncope in 2004 thought to be vasovagal. 2. Left heart catheterization in 1996 done by Dr. Swaziland was negative per report.  Adenosine Myoview in January 2010:  EF 64%, normal wall motion, mild inferobasilar fixed defect that was likely attenuation, no scar or ischemia.  3.  COPD.  Quit smoking 11/10.  4.  Peptic  ulcer disease. 5.  History of restless legs syndrome. 6.  Atypical atrial flutter, this is paroxysmal.  Admission in 11/10 with atrial flutter/RVR, patient underwent TEE-guided DCCV but flutter recurred by 12/10.  7.  Echo (11/10): EF 70%, moderate LVH, mild MR, no regional wall motion abnormalities.   Family History: Reviewed history from 01/05/2009 and no changes required. The patient's father has a history of stroke.  The patient's brother had coronary artery disease.  Social History: Reviewed history from 12/09/2009 and no changes required. The patient lives in the [place] with his wife.  He is retired from the [employer].  He has a greater than 60-pack-year history of smoking, quit 11/10.  He denies drinking alcohol.  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  Review of Systems       All systems reviewed and negative except as per HPI.   Vital Signs:  Patient profile:   77 year old male Height:      70 inches Weight:      143 pounds Pulse rate:   70 / minute Pulse rhythm:   regular BP sitting:   126 / Trevor  (right arm) Cuff size:   regular  Vitals Entered By: Judithe Modest CMA (January 10, 2010 11:54 AM)  Physical Exam  General:  Well developed, well nourished, in no acute distress. Neck:  Neck supple, no JVD. No masses, thyromegaly or abnormal cervical nodes. Lungs:  Clear bilaterally  to auscultation and percussion. Heart:  Non-displaced PMI, chest non-tender; irregular rate and rhythm, S1, S2 without murmurs, rubs or gallops. Carotid upstroke normal, no bruit.  Pedals normal pulses. No edema, no varicosities. Abdomen:  Bowel sounds positive; abdomen soft and non-tender without masses, organomegaly, or hernias noted. No hepatosplenomegaly. Extremities:  No clubbing or cyanosis. Neurologic:  Alert and oriented x 3. Psych:  Normal affect.   Impression & Recommendations:  Problem # 1:  ATRIAL FLUTTER (ICD-427.32) Atypical flutter.  This has recurred post-cardioversion in 11/10.  Rate  is good on current regimen of Toprol XL and diltiazem CD which I will continue.  Continue coumadin (no falls, steady on feet) as his CHADSVASC score is 2 and he has a moderate stroke risk.   Patient Instructions: 1)  Your physician wants you to follow-up in: 6 months with Dr Marca Ancona   You will receive a reminder letter in the mail two months in advance. If you don't receive a letter, please call our office to schedule the follow-up appointment.

## 2011-01-25 NOTE — Assessment & Plan Note (Signed)
Summary: f60m  Medications Added DILTIAZEM HCL CR 240 MG XR24H-CAP (DILTIAZEM HCL) one daily      Allergies Added:   Visit Type:  Follow-up Primary Provider:  Jarome Matin  CC:  no complains.  History of Present Illness: 77 yo with h/o COPD and atypical atrial flutter returns for followup after hospitalization with atrial flutter and rapid ventricular response in 11/10.  While in the hospital, he underwent TEE-guided DCCV successfully.  Unfortunately, he went back into atrial flutter and remains in atrial flutter today.    He has been doing well in general.  No chest pain or exertional dyspnea.  No syncope.  He works in the yard (mows grass, has a garden).  He rarely smokes a cigarette.  He does report swelling in his left leg for the last month.  Finally, BP is low today at 90/48 on my check.  He denies lightheadedness/orthostatic-type symptoms.    ECG: Atypical atrial flutter with rate 62, RBBB  Labs (11/10): creatinine 1.11, K 3.8  Current Medications (verified): 1)  Diltiazem Hcl Er Beads 360 Mg Xr24h-Cap (Diltiazem Hcl Er Beads) .... Take One Capsule By Mouth Daily 2)  Warfarin Sodium 5 Mg Tabs (Warfarin Sodium) .... Use As Directed By Anticoagulation Clinic 3)  Toprol Xl 25 Mg Xr24h-Tab (Metoprolol Succinate) .... One Tablet Twice A Day  Allergies (verified): 1)  ! Penicillin 2)  ! Augmentin  Past History:  Past Medical History: Reviewed history from 12/09/2009 and no changes required. 1.  Episode of syncope in 2004 thought to be vasovagal. 2. Left heart catheterization in 1996 done by Dr. Swaziland was negative per report.  Adenosine Myoview in January 2010:  EF 64%, normal wall motion, mild inferobasilar fixed defect that was likely attenuation, no scar or ischemia.  3.  COPD.  Quit smoking 11/10.  4.  Peptic ulcer disease. 5.  History of restless legs syndrome. 6.  Atypical atrial flutter, this is paroxysmal.  Admission in 11/10 with atrial flutter/RVR, patient  underwent TEE-guided DCCV but flutter recurred by 12/10.  7.  Echo (11/10): EF 70%, moderate LVH, mild MR, no regional wall motion abnormalities.   Family History: Reviewed history from 01/05/2009 and no changes required. The patient's father has a history of stroke.  The patient's brother had coronary artery disease.  Social History: Reviewed history from 12/09/2009 and no changes required. The patient lives in [place] with his wife.  He is retired from the [employer].  He has a greater than 60-pack-year history of smoking, quit 11/10.  He denies drinking alcohol.  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  Review of Systems       All systems reviewed and negative except as per HPI.   Vital Signs:  Patient profile:   77 year old male Height:      70 inches Weight:      137 pounds BMI:     19.73 Pulse rate:   62 / minute BP sitting:   86 / 54  (left arm) Cuff size:   regular  Vitals Entered By: Hardin Negus, RMA (July 11, 2010 2:28 PM)  Physical Exam  General:  Well developed, well nourished, in no acute distress. Neck:  Neck supple, no JVD. No masses, thyromegaly or abnormal cervical nodes. Lungs:  Clear bilaterally to auscultation and percussion. Heart:  Non-displaced PMI, chest non-tender; irregular rate and rhythm, S1, S2 without murmurs, rubs or gallops. Carotid upstroke normal, no bruit.  Pedals normal pulses. 1+ edema 1/2 up left lower leg with bilateral  varicosities. Abdomen:  Bowel sounds positive; abdomen soft and non-tender without masses, organomegaly, or hernias noted. No hepatosplenomegaly. Extremities:  No clubbing or cyanosis. Neurologic:  Alert and oriented x 3. Psych:  Normal affect.   Impression & Recommendations:  Problem # 1:  ATRIAL FLUTTER (ICD-427.32) Atypical flutter.  This has recurred post-cardioversion in 11/10.  Rate is good on current regimen.  Continue coumadin (no falls, steady on feet) as his CHADSVASC score is 2 and he has a moderate stroke risk.    Problem # 2:  ANKLE EDEMA (ICD-782.3) Asymmetric left lower leg edema.  There are also varicosities but these seem equal between the two legs.  He is on coumadin but I cannot explain the asymmetric edema so will get a lower extremity ultrasound to rule out DVT.  Possibly this is an effect of diltiazem (peripheral edema), though I am not sure why this would be so asymmetric.   Problem # 3:  HYPOTENSION (ICD-458.9) BP is low today though patient denies any lightheadedness or weakness.  I will have him cut back on diltiazem CD to 240 mg daily.  He will check his BP daily and we will call him in about 10 days to see what BP is running.   Other Orders: EKG w/ Interpretation (93000) Venous Duplex Lower Extremity (Venous Duplex Lower)  Patient Instructions: 1)  Your physician has recommended you make the following change in your medication:  2)  Decrease Diltiazem to 240mg  daily. 3)  Your physician has requested that you regularly monitor and record your blood pressure readings at home.  Please use the same machine at the same time of day to check your readings. I will  call you in 10 days to get the readings. 4)  Your physician wants you to follow-up in: 6 months with Dr Shirlee Latch.  You will receive a reminder letter in the mail two months in advance. If you don't receive a letter, please call our office to schedule the follow-up appointment. 5)  Your physician has requested that you have a lower or upper extremity venous duplex.  This test is an ultrasound of the veins in the legs or arms.  It looks at venous blood flow that carries blood from the heart to the legs or arms.  Allow one hour for a Lower Venous exam.  Allow thirty minutes for an Upper Venous exam. There are no restrictions or special instructions. THIS WILL BE TODAY.  Prescriptions: DILTIAZEM HCL CR 240 MG XR24H-CAP (DILTIAZEM HCL) one daily  #30 x 11   Entered by:   Katina Dung, RN, BSN   Authorized by:   Marca Ancona, MD   Signed  by:   Katina Dung, RN, BSN on 07/11/2010   Method used:   [facility]        [facility] [address] [phone]* (retail)       [address]       [place], [place]  [address]       Ph: [phone] or [phone]       Fax: [phone]   RxID:   [id number]

## 2011-01-25 NOTE — Medication Information (Signed)
Summary: rov/sp   Anticoagulant Therapy  Managed by: Reina Fuse, PharmD Referring MD: Shirlee Latch MD, Freida Busman PCP: Gwenette Greet MD: Graciela Husbands MD, Viviann Spare Indication 1: Atrial Fibrillation Indication 2: Atrial Flutter Lab Used: [facility] [place] Site: [address] INR POC 3.0 INR RANGE 2-3  Dietary changes: no    Health status changes: no    Bleeding/hemorrhagic complications: no    Recent/future hospitalizations: no    Any changes in medication regimen? no    Recent/future dental: no  Any missed doses?: no       Is patient compliant with meds? yes       Allergies: 1)  ! Penicillin 2)  ! Augmentin  Anticoagulation Management History:      The patient is taking warfarin and comes in today for a routine follow up visit.  Positive risk factors for bleeding include an age of 75 years or older.  The bleeding index is 'intermediate risk'.  Positive CHADS2 values include Age > 75 years old.  Anticoagulation responsible provider: Graciela Husbands MD, Viviann Spare.  INR POC: 3.0.  Cuvette Lot#: [id number].  Exp: 11/2011.    Anticoagulation Management Assessment/Plan:      The patient's current anticoagulation dose is Warfarin sodium 5 mg tabs: Use as directed by Anticoagulation Clinic.  The target INR is 2.0-3.0.  The next INR is due 12/28/2010.  Anticoagulation instructions were given to patient/spouse.  Results were reviewed/authorized by Reina Fuse, PharmD.  He was notified by Reina Fuse PharmD.         Prior Anticoagulation Instructions: INR 2.6  Continue same dose of 1/2 tablet every day except 1 tablet on Monday.  Recheck INR in 3 weeks.   Current Anticoagulation Instructions: INR 3.0  Continue taking Coumadin 0.5 tab (2.5 mg) every day except for Coumadin 1 tab (5 mg) on Mondays. Return to clinic in 3 weeks to coincide with Dr. Shirlee Latch appt.

## 2011-01-25 NOTE — Medication Information (Signed)
Summary: ROV/LB      Allergies Added:  Anticoagulant Therapy  Managed by: Jeralene Peters, PharmD Referring MD: Shirlee Latch MD, Freida Busman PCP: Jarome Matin Supervising MD: Jens Som MD, Arlys John Indication 1: Atrial Fibrillation Indication 2: Atrial Flutter [place] Site: [address] INR POC 3 INR RANGE 2-3  Dietary changes: no    Health status changes: no    Bleeding/hemorrhagic complications: no    Recent/future hospitalizations: no    Any changes in medication regimen? no    Recent/future dental: no  Any missed doses?: no       Is patient compliant with meds? yes      Comments: TOOK 1 FULL TABLET ON  A DAY HE WAS SUPPOSE TO TAKE 1/2 TABLET  Current Medications (verified): 1)  Diltiazem Hcl Er Beads 360 Mg Xr24h-Cap (Diltiazem Hcl Er Beads) .... Take One Capsule By Mouth Daily 2)  Warfarin Sodium 5 Mg Tabs (Warfarin Sodium) .... Use As Directed By Anticoagulation Clinic 3)  Spiriva Handihaler 18 Mcg Caps (Tiotropium Bromide Monohydrate) .... Inhale 1 Capsule Daily. 4)  Toprol Xl 25 Mg Xr24h-Tab (Metoprolol Succinate) .... One Tablet Twice A Day 5)  Omeprazole 20 Mg Cpdr (Omeprazole) .... Take One Capsule By Mouth Daily  Allergies (verified): 1)  ! Penicillin 2)  ! Augmentin  Anticoagulation Management History:      The patient is taking warfarin and comes in today for a routine follow up visit.  Positive risk factors for bleeding include an age of 75 years or older.  The bleeding index is 'intermediate risk'.  Positive CHADS2 values include Age > 75 years old.  Anticoagulation responsible provider: Jens Som MD, Arlys John.  INR POC: 3.  Cuvette Lot#: [id number].  Exp: 03/2011.    Anticoagulation Management Assessment/Plan:      The patient's current anticoagulation dose is Warfarin sodium 5 mg tabs: Use as directed by Anticoagulation Clinic.  The target INR is 2.0-3.0.  The next INR is due 01/30/2010.  Results were reviewed/authorized by Jeralene Peters, PharmD.         Prior  Anticoagulation Instructions: INR 2.6  CONTINUE TAKING 0.5 TABLETS EVERYDAY EXCEPT TAKE 1 TABLET ON MONDAYS AND FRIDAYS.  RECHECK IN 2 WEEKS.  Current Anticoagulation Instructions: INR 3  CONTINUE TAKING 0.5 TABLETS EVERYDAY EXCEPT TAKE 1 TABLET ON MONDAYS AND FRIDAYS. RECHECK IN 3 WEEKS.

## 2011-01-25 NOTE — Medication Information (Signed)
Summary: rov/lb  Anticoagulant Therapy  Managed by: Cloyde Reams, RN, BSN Referring MD: Shirlee Latch MD, Freida Busman PCP: Jarome Matin Supervising MD: Ladona Ridgel MD, Sharlot Gowda Indication 1: Atrial Fibrillation Indication 2: Atrial Flutter [place] Site: [address] INR POC 2.9 INR RANGE 2-3  Dietary changes: no    Health status changes: no    Bleeding/hemorrhagic complications: no    Recent/future hospitalizations: no    Any changes in medication regimen? no    Recent/future dental: no  Any missed doses?: yes     Details: Took 1 tablet instead of 1/2.  Is patient compliant with meds? yes       Allergies (verified): 1)  ! Penicillin 2)  ! Augmentin  Anticoagulation Management History:      The patient is taking warfarin and comes in today for a routine follow up visit.  Positive risk factors for bleeding include an age of 75 years or older.  The bleeding index is 'intermediate risk'.  Positive CHADS2 values include Age > 66 years old.  Anticoagulation responsible provider: Ladona Ridgel MD, Sharlot Gowda.  INR POC: 2.9.  Cuvette Lot#: [id number].  Exp: 04/2011.    Anticoagulation Management Assessment/Plan:      The patient's current anticoagulation dose is Warfarin sodium 5 mg tabs: Use as directed by Anticoagulation Clinic.  The target INR is 2.0-3.0.  The next INR is due 03/27/2010.  Results were reviewed/authorized by Cloyde Reams, RN, BSN.  He was notified by Cloyde Reams RN.         Prior Anticoagulation Instructions: INR 2.7  Continue taking 1/2 tablet daily except  take 1 tablet  on Mondays and Fridays.  Recheck in 4 weeks.  Current Anticoagulation Instructions: INR 2.9  Continue on same dosage 1/2 tablet daily except 1 tablet on Mondays and Fridays.  Recheck in 4 weeks.

## 2011-01-25 NOTE — Medication Information (Signed)
Summary: rov/sp  Anticoagulant Therapy  Managed by: Eda Keys, PharmD Referring MD: Shirlee Latch MD, Freida Busman PCP: Gwenette Greet MD: Juanda Chance MD, Sharonica Kraszewski Indication 1: Atrial Fibrillation Indication 2: Atrial Flutter Lab Used: [facility] [place] Site: [address] INR POC 2.8 INR RANGE 2-3  Dietary changes: no    Health status changes: no    Bleeding/hemorrhagic complications: no    Recent/future hospitalizations: no    Any changes in medication regimen? no    Recent/future dental: no  Any missed doses?: no       Is patient compliant with meds? yes       Allergies: 1)  ! Penicillin 2)  ! Augmentin  Anticoagulation Management History:      The patient is taking warfarin and comes in today for a routine follow up visit.  Positive risk factors for bleeding include an age of 75 years or older.  The bleeding index is 'intermediate risk'.  Positive CHADS2 values include Age > 75 years old.  Anticoagulation responsible provider: Juanda [place] MD, Smitty Cords.  INR POC: 2.8.  Cuvette Lot#: [id number].  Exp: 05/2011.    Anticoagulation Management Assessment/Plan:      The patient's current anticoagulation dose is Warfarin sodium 5 mg tabs: Use as directed by Anticoagulation Clinic.  The target INR is 2.0-3.0.  The next INR is due 05/23/2010.  Anticoagulation instructions were given to patient.  Results were reviewed/authorized by Eda Keys, PharmD.  He was notified by Eda Keys.         Prior Anticoagulation Instructions: INR 3.0  Continue the same dose of 1/2 tablet every day except 1 tablet on Monday and Friday   Current Anticoagulation Instructions: INR 2.8  Continue taking 1 tablet on MOnday and Friday and 1/2 tablet all other days.  Return to clinic in 4 weeks.

## 2011-01-25 NOTE — Progress Notes (Signed)
  Phone Note Other Incoming   Caller: Patient's wife Summary of Call: Patient's wife called regarding a missed dose.  Mr. Shoultz missed his dose last night (1 tablet) and pateint needed to know what to do about that.  I have instructed patient to take an extra 1/2 tablet today (to equal 1 tablet) in order to make up for yesterday's missed dose.   Initial call taken by: Eda Keys,  May 16, 2010 9:21 AM

## 2011-01-25 NOTE — Medication Information (Signed)
Summary: [facility]   Anticoagulant Therapy  Managed by: Cloyde Reams, RN, BSN Referring MD: Shirlee Latch MD, Freida Busman PCP: Gwenette Greet MD: Clifton James MD, Cristal Deer Indication 1: Atrial Fibrillation Indication 2: Atrial Flutter Lab Used: [facility] [place] Site: [address] INR POC 2.5 INR RANGE 2-3  Dietary changes: no    Health status changes: no    Bleeding/hemorrhagic complications: no    Recent/future hospitalizations: no    Any changes in medication regimen? no    Recent/future dental: no  Any missed doses?: yes     Details: Skipped 4 days prior to cateract.    Is patient compliant with meds? yes       Allergies: 1)  ! Penicillin 2)  ! Augmentin  Anticoagulation Management History:      The patient is taking warfarin and comes in today for a routine follow up visit.  Positive risk factors for bleeding include an age of 75 years or older.  The bleeding index is 'intermediate risk'.  Positive CHADS2 values include Age > 72 years old.  Anticoagulation responsible provider: Clifton James MD, Cristal Deer.  INR POC: 2.5.  Cuvette Lot#: [id number].  Exp: 09/2011.    Anticoagulation Management Assessment/Plan:      The patient's current anticoagulation dose is Warfarin sodium 5 mg tabs: Use as directed by Anticoagulation Clinic.  The target INR is 2.0-3.0.  The next INR is due 08/24/2010.  Anticoagulation instructions were given to patient.  Results were reviewed/authorized by Cloyde Reams, RN, BSN.  He was notified by Cloyde Reams RN.         Prior Anticoagulation Instructions: INR 2.6  Continue same dose of 1/2 tablet daily except 1 tablet on Monday and Friday.  Re-check INR in 4 weeks.  Current Anticoagulation Instructions: INR 2.5  Continue on same dosage 1/2 tablet daily except 1 tablet on Mondays and Fridays.  Recheck in 4 weeks.

## 2011-01-25 NOTE — Medication Information (Signed)
Summary: Trevor Brooks   Anticoagulant Therapy  Managed by: Weston Brass, PharmD Referring MD: Shirlee Latch MD, Freida Busman PCP: Gwenette Greet MD: Johney Frame MD, Fayrene Fearing Indication 1: Atrial Fibrillation Indication 2: Atrial Flutter Lab Used: [facility] [place] Site: [address] INR POC 3.3 INR RANGE 2-3  Dietary changes: no    Health status changes: no    Bleeding/hemorrhagic complications: no    Recent/future hospitalizations: yes       Details: Will have cataract surgery in the next few weeks  Any changes in medication regimen? no    Recent/future dental: no  Any missed doses?: no       Is patient compliant with meds? yes       Allergies: 1)  ! Penicillin 2)  ! Augmentin  Anticoagulation Management History:      The patient is taking warfarin and comes in today for a routine follow up visit.  Positive risk factors for bleeding include an age of 75 years or older.  The bleeding index is 'intermediate risk'.  Positive CHADS2 values include Age > 80 years old.  Anticoagulation responsible provider: Allred MD, Fayrene Fearing.  INR POC: 3.3.  Cuvette Lot#: [id number].  Exp: 10/2011.    Anticoagulation Management Assessment/Plan:      The patient's current anticoagulation dose is Warfarin sodium 5 mg tabs: Use as directed by Anticoagulation Clinic.  The target INR is 2.0-3.0.  The next INR is due 10/17/2010.  Anticoagulation instructions were given to patient.  Results were reviewed/authorized by Weston Brass, PharmD.  He was notified by Haynes Hoehn, PharmD Candidate.         Prior Anticoagulation Instructions: INR 2.6  Continue taking 1/2 tablet everyday except take 1 tablet on Mondays and Fridays. Re-check INR in 3 weeks.   Current Anticoagulation Instructions: INR 3.3  Take 1/2 tablet today. Then take 1/2 tablet every day of the week, except 1 tablet on Monday.  Return to clinic on Tuesday, October 17, 2010.

## 2011-01-25 NOTE — Medication Information (Signed)
Summary: rov/ewj  Anticoagulant Therapy  Managed by: Weston Brass, PharmD Referring MD: Shirlee Latch MD, Freida Busman PCP: Gwenette Greet MD: Clifton James MD, Cristal Deer Indication 1: Atrial Fibrillation Indication 2: Atrial Flutter Lab Used: [facility] [place] Site: [address] INR POC 3.0 INR RANGE 2-3  Dietary changes: no    Health status changes: no    Bleeding/hemorrhagic complications: no    Recent/future hospitalizations: no    Any changes in medication regimen? no    Recent/future dental: no  Any missed doses?: no       Is patient compliant with meds? yes       Allergies: 1)  ! Penicillin 2)  ! Augmentin  Anticoagulation Management History:      The patient is taking warfarin and comes in today for a routine follow up visit.  Positive risk factors for bleeding include an age of 75 years or older.  The bleeding index is 'intermediate risk'.  Positive CHADS2 values include Age > 63 years old.  Anticoagulation responsible provider: Clifton James MD, Cristal Deer.  INR POC: 3.0.  Cuvette Lot#: [id number].  Exp: 04/2011.    Anticoagulation Management Assessment/Plan:      The patient's current anticoagulation dose is Warfarin sodium 5 mg tabs: Use as directed by Anticoagulation Clinic.  The target INR is 2.0-3.0.  The next INR is due 04/24/2010.  Anticoagulation instructions were given to patient.  Results were reviewed/authorized by Weston Brass, PharmD.  He was notified by Weston Brass PharmD.         Prior Anticoagulation Instructions: INR 2.9  Continue on same dosage 1/2 tablet daily except 1 tablet on Mondays and Fridays.  Recheck in 4 weeks.    Current Anticoagulation Instructions: INR 3.0  Continue the same dose of 1/2 tablet every day except 1 tablet on Monday and Friday

## 2011-01-25 NOTE — Medication Information (Signed)
Summary: rov/sl   Anticoagulant Therapy  Managed by: Weston Brass, PharmD Referring MD: Shirlee Latch MD, Freida Busman PCP: Gwenette Greet MD: Myrtis Ser MD, Tinnie Gens Indication 1: Atrial Fibrillation Indication 2: Atrial Flutter Lab Used: [facility] [place] Site: [address] INR POC 2.8 INR RANGE 2-3  Dietary changes: no    Health status changes: no    Bleeding/hemorrhagic complications: no    Recent/future hospitalizations: no    Any changes in medication regimen? no    Recent/future dental: no  Any missed doses?: no       Is patient compliant with meds? yes       Allergies: 1)  ! Penicillin 2)  ! Augmentin  Anticoagulation Management History:      The patient is taking warfarin and comes in today for a routine follow up visit.  Positive risk factors for bleeding include an age of 75 years or older.  The bleeding index is 'intermediate risk'.  Positive CHADS2 values include Age > 69 years old.  Anticoagulation responsible provider: Myrtis Ser MD, Tinnie Gens.  INR POC: 2.8.  Cuvette Lot#: [id number].  Exp: 01/2012.    Anticoagulation Management Assessment/Plan:      The patient's current anticoagulation dose is Warfarin sodium 5 mg tabs: Use as directed by Anticoagulation Clinic.  The target INR is 2.0-3.0.  The next INR is due 01/24/2011.  Anticoagulation instructions were given to patient/spouse.  Results were reviewed/authorized by Weston Brass, PharmD.  He was notified by Weston Brass PharmD.         Prior Anticoagulation Instructions: INR 3.0  Continue taking Coumadin 0.5 tab (2.5 mg) every day except for Coumadin 1 tab (5 mg) on Mondays. Return to clinic in 3 weeks to coincide with Dr. Shirlee Latch appt.    Current Anticoagulation Instructions: INR 2.8  Continue same dose of 1/2 tablet every day except 1 tablet on Monday.  Recheck INR in 4 weeks.

## 2011-01-25 NOTE — Miscellaneous (Signed)
Clinical Lists Changes  Observations: Added new observation of ECHOINTERP:   1. Left ventricle: The cavity size was normal. There was moderate      concentric hypertrophy. The estimated ejection fraction was 70%.      Wall motion was normal; there were no regional wall motion      abnormalities.   2. Mitral valve: Mildly to moderately calcified annulus. Mild      regurgitation. (11/16/2009 12:06) Added new observation of TEEFINDING:   1. Left ventricle: The cavity size was normal. Systolic function was      normal. The estimated ejection fraction was 60%. Wall motion was      normal; there were no regional wall motion abnormalities. No      evidence of thrombus.   2. Aortic valve: There was no stenosis.   3. Aorta: Grade III plaque in the descending thoracic aorta and      arch. Mildly dilated aortic root.   4. Mitral valve: Mild to moderate calcification of the annulus and      posterior leaflet. Mild regurgitation.   5. Left atrium: The atrium was mildly dilated. No evidence of      thrombus in the atrial cavity or appendage. No evidence of      thrombus in the atrial cavity or appendage.   6. Right ventricle: The cavity size was normal. Systolic function      was normal.   7. Right atrium: No evidence of thrombus in the atrial cavity or      appendage.   8. Tricuspid valve: Peak RV-RA gradient: 27mm Hg (S).   Impressions:    - No cardiac source of emboli was indentified. Successful     cardioversion. (11/16/2009 12:06)      TE Echocardiogram  Procedure date:  11/16/2009  Findings:        1. Left ventricle: The cavity size was normal. Systolic function was      normal. The estimated ejection fraction was 60%. Wall motion was      normal; there were no regional wall motion abnormalities. No      evidence of thrombus.   2. Aortic valve: There was no stenosis.   3. Aorta: Grade III plaque in the descending thoracic aorta and      arch. Mildly dilated aortic root.   4.  Mitral valve: Mild to moderate calcification of the annulus and      posterior leaflet. Mild regurgitation.   5. Left atrium: The atrium was mildly dilated. No evidence of      thrombus in the atrial cavity or appendage. No evidence of      thrombus in the atrial cavity or appendage.   6. Right ventricle: The cavity size was normal. Systolic function      was normal.   7. Right atrium: No evidence of thrombus in the atrial cavity or      appendage.   8. Tricuspid valve: Peak RV-RA gradient: 27mm Hg (S).   Impressions:    - No cardiac source of emboli was indentified. Successful     cardioversion.  Echocardiogram  Procedure date:  11/16/2009  Findings:        1. Left ventricle: The cavity size was normal. There was moderate      concentric hypertrophy. The estimated ejection fraction was 70%.      Wall motion was normal; there were no regional wall motion      abnormalities.   2. Mitral valve: Mildly to moderately  calcified annulus. Mild      regurgitation.

## 2011-01-31 NOTE — Medication Information (Signed)
Summary: rov/ewj   Anticoagulant Therapy  Managed by: Weston Brass, PharmD Referring MD: Shirlee Latch MD, Freida Busman PCP: Gwenette Greet MD: Ladona Ridgel MD, Sharlot Gowda Indication 1: Atrial Fibrillation Indication 2: Atrial Flutter Lab Used: [facility] [place] Site: [address] INR POC 3.3 INR RANGE 2-3  Dietary changes: no    Health status changes: no    Bleeding/hemorrhagic complications: no    Recent/future hospitalizations: no    Any changes in medication regimen? no    Recent/future dental: no  Any missed doses?: no       Is patient compliant with meds? yes       Allergies: 1)  ! Penicillin 2)  ! Augmentin  Anticoagulation Management History:      The patient is taking warfarin and comes in today for a routine follow up visit.  Positive risk factors for bleeding include an age of 75 years or older.  The bleeding index is 'intermediate risk'.  Positive CHADS2 values include Age > 70 years old.  Anticoagulation responsible provider: Ladona Ridgel MD, Sharlot Gowda.  INR POC: 3.3.  Cuvette Lot#: [id number].  Exp: 12/2011.    Anticoagulation Management Assessment/Plan:      The patient's current anticoagulation dose is Warfarin sodium 5 mg tabs: Use as directed by Anticoagulation Clinic.  The target INR is 2.0-3.0.  The next INR is due 02/14/2011.  Anticoagulation instructions were given to patient/spouse.  Results were reviewed/authorized by Weston Brass, PharmD.  He was notified by Weston Brass PharmD.         Prior Anticoagulation Instructions: INR 2.8  Continue same dose of 1/2 tablet every day except 1 tablet on Monday.  Recheck INR in 4 weeks.   Current Anticoagulation Instructions: INR 3.3  Skip today's dose of Coumadin then resume same dose of 1/2 tablet every day except 1 tablet on Monday.  Recheck INR in 3 weeks.

## 2011-02-06 DIAGNOSIS — I4892 Unspecified atrial flutter: Secondary | ICD-10-CM

## 2011-02-06 DIAGNOSIS — I4891 Unspecified atrial fibrillation: Secondary | ICD-10-CM | POA: Insufficient documentation

## 2011-02-14 ENCOUNTER — Encounter (INDEPENDENT_AMBULATORY_CARE_PROVIDER_SITE_OTHER): Payer: Medicare PPO

## 2011-02-14 ENCOUNTER — Encounter: Payer: Self-pay | Admitting: Cardiology

## 2011-02-14 DIAGNOSIS — I4891 Unspecified atrial fibrillation: Secondary | ICD-10-CM

## 2011-02-14 DIAGNOSIS — I4892 Unspecified atrial flutter: Secondary | ICD-10-CM

## 2011-02-14 DIAGNOSIS — Z7901 Long term (current) use of anticoagulants: Secondary | ICD-10-CM

## 2011-02-14 LAB — CONVERTED CEMR LAB: POC INR: 2.9

## 2011-02-20 NOTE — Medication Information (Signed)
Summary: rov/sp  Anticoagulant Therapy  Managed by: Bethena Midget, RN, BSN Referring MD: Shirlee Latch MD, Berklee Battey PCP: Gwenette Greet MD: Shirlee Latch MD, Shalaina Guardiola Indication 1: Atrial Fibrillation Indication 2: Atrial Flutter Lab Used: [facility] [place] Site: [address] INR POC 2.9 INR RANGE 2-3  Dietary changes: no    Health status changes: yes       Details: [employer] 10 days ago had cold  Bleeding/hemorrhagic complications: yes       Details: From Left nare every morning has occ nose bleed  Recent/future hospitalizations: no    Any changes in medication regimen? yes       Details: Took a Z-pk finished on Monday and also took [employer].   Recent/future dental: no  Any missed doses?: no       Is patient compliant with meds? yes       Allergies: 1)  ! Penicillin 2)  ! Augmentin  Anticoagulation Management History:      The patient is taking warfarin and comes in today for a routine follow up visit.  Positive risk factors for bleeding include an age of 75 years or older.  The bleeding index is 'intermediate risk'.  Positive CHADS2 values include Age > 67 years old.  Anticoagulation responsible provider: Shirlee Latch MD, Dreon Pineda.  INR POC: 2.9.  Cuvette Lot#: [id number].  Exp: 12/2011.    Anticoagulation Management Assessment/Plan:      The patient's current anticoagulation dose is Warfarin sodium 5 mg tabs: Use as directed by Anticoagulation Clinic.  The target INR is 2.0-3.0.  The next INR is due 03/14/2011.  Anticoagulation instructions were given to patient/spouse.  Results were reviewed/authorized by Bethena Midget, RN, BSN.  He was notified by Bethena Midget, RN, BSN.         Prior Anticoagulation Instructions: INR 3.3  Skip today's dose of Coumadin then resume same dose of 1/2 tablet every day except 1 tablet on Monday.  Recheck INR in 3 weeks.   Current Anticoagulation Instructions: INR 2.9 Continue 1/2 pill everyday except 1 pill on Mondays. Recheck in  4 weeks.

## 2011-03-14 ENCOUNTER — Ambulatory Visit (INDEPENDENT_AMBULATORY_CARE_PROVIDER_SITE_OTHER): Payer: Medicare PPO | Admitting: *Deleted

## 2011-03-14 DIAGNOSIS — I4892 Unspecified atrial flutter: Secondary | ICD-10-CM

## 2011-03-14 DIAGNOSIS — I4891 Unspecified atrial fibrillation: Secondary | ICD-10-CM

## 2011-03-14 LAB — POCT INR: INR: 2.5

## 2011-03-14 NOTE — Patient Instructions (Signed)
INR 2.5 Continue 1/2 pill everyday except 1 pill on Mondays, Recheck in 4 weeks.

## 2011-03-19 ENCOUNTER — Other Ambulatory Visit: Payer: Self-pay | Admitting: Cardiovascular Disease

## 2011-03-23 ENCOUNTER — Other Ambulatory Visit: Payer: Self-pay

## 2011-03-23 MED ORDER — WARFARIN SODIUM 5 MG PO TABS: 5.0000 mg | ORAL_TABLET | ORAL | Status: DC

## 2011-03-26 ENCOUNTER — Other Ambulatory Visit: Payer: Self-pay

## 2011-03-26 MED ORDER — WARFARIN SODIUM 5 MG PO TABS: 5.0000 mg | ORAL_TABLET | ORAL | Status: DC

## 2011-03-28 LAB — CK TOTAL AND CKMB (NOT AT ARMC): CK, MB: 2.6 ng/mL (ref 0.3–4.0)

## 2011-03-28 LAB — CBC
HCT: 35 % — ABNORMAL LOW (ref 39.0–52.0)
HCT: 36.2 % — ABNORMAL LOW (ref 39.0–52.0)
HCT: 36.4 % — ABNORMAL LOW (ref 39.0–52.0)
HCT: 39.5 % (ref 39.0–52.0)
Hemoglobin: 11.9 g/dL — ABNORMAL LOW (ref 13.0–17.0)
Hemoglobin: 12.5 g/dL — ABNORMAL LOW (ref 13.0–17.0)
Hemoglobin: 13.5 g/dL (ref 13.0–17.0)
Platelets: 147 10*3/uL — ABNORMAL LOW (ref 150–400)
Platelets: 157 10*3/uL (ref 150–400)
Platelets: 159 10*3/uL (ref 150–400)
Platelets: 195 10*3/uL (ref 150–400)
RBC: 4.06 MIL/uL — ABNORMAL LOW (ref 4.22–5.81)
RBC: 4.14 MIL/uL — ABNORMAL LOW (ref 4.22–5.81)
RDW: 14.5 % (ref 11.5–15.5)
WBC: 11.2 10*3/uL — ABNORMAL HIGH (ref 4.0–10.5)
WBC: 12.9 10*3/uL — ABNORMAL HIGH (ref 4.0–10.5)
WBC: 8.9 10*3/uL (ref 4.0–10.5)
WBC: 9.1 10*3/uL (ref 4.0–10.5)
WBC: 9.1 10*3/uL (ref 4.0–10.5)

## 2011-03-28 LAB — COMPREHENSIVE METABOLIC PANEL
ALT: 11 U/L (ref 0–53)
Albumin: 3.2 g/dL — ABNORMAL LOW (ref 3.5–5.2)
Alkaline Phosphatase: 44 U/L (ref 39–117)
Chloride: 105 mEq/L (ref 96–112)
Glucose, Bld: 93 mg/dL (ref 70–99)
Potassium: 4.4 mEq/L (ref 3.5–5.1)
Sodium: 138 mEq/L (ref 135–145)
Total Bilirubin: 0.5 mg/dL (ref 0.3–1.2)
Total Protein: 5.3 g/dL — ABNORMAL LOW (ref 6.0–8.3)

## 2011-03-28 LAB — POCT CARDIAC MARKERS
CKMB, poc: 1.8 ng/mL (ref 1.0–8.0)
Myoglobin, poc: 91 ng/mL (ref 12–200)

## 2011-03-28 LAB — POCT I-STAT, CHEM 8
Chloride: 100 mEq/L (ref 96–112)
HCT: 42 % (ref 39.0–52.0)
Hemoglobin: 14.3 g/dL (ref 13.0–17.0)
Potassium: 4.2 mEq/L (ref 3.5–5.1)

## 2011-03-28 LAB — DIFFERENTIAL
Eosinophils Relative: 1 % (ref 0–5)
Lymphocytes Relative: 21 % (ref 12–46)
Lymphs Abs: 1.8 10*3/uL (ref 0.7–4.0)
Monocytes Absolute: 0.5 10*3/uL (ref 0.1–1.0)
Neutro Abs: 6.5 10*3/uL (ref 1.7–7.7)

## 2011-03-28 LAB — CARDIAC PANEL(CRET KIN+CKTOT+MB+TROPI)
Relative Index: 1.9 (ref 0.0–2.5)
Relative Index: 2.4 (ref 0.0–2.5)
Total CK: 229 U/L (ref 7–232)
Troponin I: 0.06 ng/mL (ref 0.00–0.06)
Troponin I: 0.08 ng/mL — ABNORMAL HIGH (ref 0.00–0.06)
Troponin I: 0.09 ng/mL — ABNORMAL HIGH (ref 0.00–0.06)

## 2011-03-28 LAB — HEPARIN LEVEL (UNFRACTIONATED)
Heparin Unfractionated: 0.18 IU/mL — ABNORMAL LOW (ref 0.30–0.70)
Heparin Unfractionated: 0.33 IU/mL (ref 0.30–0.70)
Heparin Unfractionated: 0.39 IU/mL (ref 0.30–0.70)

## 2011-03-28 LAB — BASIC METABOLIC PANEL
GFR calc Af Amer: >60 mL/min (ref >60)
GFR calc non Af Amer: 56 mL/min — ABNORMAL LOW (ref >60)
GFR calc non Af Amer: >60 mL/min (ref >60)
Potassium: 3.6 mEq/L (ref 3.5–5.1)
Potassium: 4.2 mEq/L (ref 3.5–5.1)
Sodium: 141 mEq/L (ref 135–145)
Sodium: 142 mEq/L (ref 135–145)

## 2011-03-28 LAB — PROTIME-INR
INR: 1.07 (ref 0.00–1.49)
INR: 1.23 (ref 0.00–1.49)
Prothrombin Time: 13.2 seconds (ref 11.6–15.2)

## 2011-04-11 ENCOUNTER — Ambulatory Visit (INDEPENDENT_AMBULATORY_CARE_PROVIDER_SITE_OTHER): Payer: Medicare PPO | Admitting: *Deleted

## 2011-04-11 DIAGNOSIS — I4891 Unspecified atrial fibrillation: Secondary | ICD-10-CM

## 2011-04-11 DIAGNOSIS — I4892 Unspecified atrial flutter: Secondary | ICD-10-CM

## 2011-04-11 LAB — POCT INR: INR: 2.5

## 2011-05-07 ENCOUNTER — Other Ambulatory Visit: Payer: Self-pay | Admitting: Cardiology

## 2011-05-08 NOTE — Discharge Summary (Signed)
NAME:  Trevor Brooks, Trevor Brooks NO.:  [id number]   MEDICAL RECORD NO.:  [id number]          PATIENT TYPE:  INP   LOCATION:  2034                         FACILITY:  [facility]   PHYSICIAN:  Pricilla Riffle, MD, FACCDATE OF BIRTH:  [birth year]   DATE OF ADMISSION:  12/19/2008  DATE OF DISCHARGE:  12/20/2008                               DISCHARGE SUMMARY   PROCEDURES PERFORMED DURING HOSPITALIZATION:  None.   FINAL DISCHARGE DIAGNOSES:  1. Chest discomfort.  2. Hypotension.  3. History of atrial fibrillation.  4. History of syncope in 2004 felt to be vasovagal.  5. History of chronic obstructive pulmonary disease.  6. Peptic ulcer disease and hiatal hernia.  7. History of incomplete right bundle-branch block.  8. History of restless leg syndrome.  9. History of to small lung nodules in the right major fissure,      followup recommended.  10.Ongoing tobacco use.   HOSPITAL COURSE:  This is an 77 year old male with no prior history of  coronary artery disease who awoke at 3 a.m. with an aching sensation in  his chest and took an extra dose of metoprolol as a result he went back  to sleep and while at church the following morning he felt lightheaded  and had some presyncope and became diaphoretic.  He told the people of  the church about his symptoms and was brought to the emergency room.  Upon arrival, he was found to be hypotensive with a blood pressure in  the 60s.  He was given IV fluids and blood pressure normalized.  The  patient was admitted to rule out myocardial infarction and to evaluate  reason for hypotension.   EKG:  The patient appeared to have an atrial flutter with heart rate in  the 70s, but remained in normal sinus rhythm with PACs after IV fluids.  His TED score was 1 with his age greater than 31 and anticoagulation  with Coumadin was not recommended.   The patient was followed to rule out myocardial infarction.  Cardiac  enzymes were found to be negative  x3.  The patient was very anxious to  return home and he will have a followup appointment with Dr. Shirlee Latch as  an outpatient after having had an adenosine stress Myoview.   DISCHARGE LABS:  Sodium 140, potassium 4.0, chloride 107, CO2 of 26,  glucose 86, BUN 22, creatinine 1.22, troponins negative x3, 0.03, 0.06  and 0.06 respectively.  TSH 3.468.  Cholesterol 149, lipids 106, HDL 27,  LDL 101.  BNP 233.  Hemoglobin 14.3, hematocrit 44.1, white blood cells  9.9, and platelets 239.  Chest x-ray revealing no active disease in his  chest.  EKG revealed sinus rhythm with a ventricular rate of 73 beats  per minute.   DISCHARGE MEDICATIONS:  Lopressor 25 mg twice a day and aspirin 325 mg  twice a day.   ALLERGIES:  PENICILLIN.   FOLLOWUP PLANS AND APPOINTMENTS:  1. The patient will follow up with Dr. Alford Highland office as an      outpatient.  Stress Myoview on December 28, 2008, at 9:30 a.m.  2. The patient will follow up with Dr. Shirlee Latch on January 04, 2009, at      11:30 a.m. for followup appointment.  The patient has been advised      to change the medications and has been given prescriptions for      higher dose of Lopressor.   Time spent with the patient to include physician time 35 minutes.      Bettey Mare. Lyman Bishop, NP      Pricilla Riffle, MD, [facility]  Electronically Signed    KML/MEDQ  D:  12/20/2008  T:  12/21/2008  Job:  [id number]

## 2011-05-08 NOTE — Assessment & Plan Note (Signed)
[facility]                            CARDIOLOGY OFFICE NOTE   NAME:Trevor Brooks, Trevor Brooks                    MRN:          [id number]  DATE:01/04/2009                            DOB:          03-02-1922    PRIMARY CARE PHYSICIAN:  Jarome Matin, MD   HISTORY OF PRESENT ILLNESS:  This is an 77 year old who presents with a  history of COPD and atypical atrial flutter who presents to Cardiology  Clinic for followup of recent hospitalization.  The patient woke on the  morning of December 19, 2008, with an aching sensation throughout the  left side of his body, this later resolved.  He went to church and  became lightheaded while he was in church.  He checked his pulse and  felt that his pulse was racing.  EMS was called.  They found his  systolic blood pressure in the 70s.  He received a bolus of normal  saline and his blood pressure came up into the 110s systolic.  In the  ER, he noted to be in atrial flutter.  At that time, it had slowed down  to a rate in the 70s.  During his hospitalization, he went back into  normal sinus rhythm.  Cardiac enzymes were negative x3.  Given his  CHADS2 score of only 1 (advanced age), he was not started on Coumadin.  He was begun on aspirin 325 mg daily and Lopressor 25 mg twice a day.  He did have adenosine Myoview done as an outpatient, which showed no  evidence for scar or ischemia.  Since discharge, he has been doing quite  well.  He has had no chest pain.  No episodes of palpitations or heart  racing.  He had no further episodes of lightheadedness.  Echo in  December showed a preserved EF.  He is quite active, works in his yard  with no dyspnea on exertion, and it is actually going to be his 87th  birthday tomorrow.   PAST MEDICAL HISTORY:  1. Episode of syncope in 2004 thought to be vasovagal.  2. Left heart catheterization in 1996 done by Dr. [place] was negative      per report.  3. COPD.  The patient  continues to smoke about a pack every 10 days.  4. Peptic ulcer disease.  5. History of restless legs syndrome.  6. Atypical atrial flutter, this is paroxysmal.  The patient is in      normal sinus rhythm today.  7. Echo on December 04, 2008, EF 60%, mild-to-moderate LVH, moderate      left atrial enlargement, mild MR, and mild TR.  8. Adenosine Myoview in January 2010, EF 64%, normal wall motion, mild      inferobasilar fixed defect that is likely attenuation.  There is no      scar or ischemia.   SOCIAL HISTORY:  The patient lives in the [place] with his wife.  He is retired from the [employer].  He has a greater than 60-pack-  year history of smoking.  He denied drinking alcohol.   FAMILY HISTORY:  The  patient's father has a history of stroke.  The  patient's brother had coronary artery disease.   REVIEW OF SYSTEMS:  Negative except as noted in the history present  illness.   MEDICATIONS:  1. Lopressor 25 mg b.i.d.  2. Aspirin 325 mg daily.   LABORATORY DATA:  Most recent labs in December 2009, BNP 233.  TSH  normal.  Creatinine 1.22, LDL 101, HDL 27, and triglycerides 161.  EKG  was reviewed today shows normal sinus rhythm at a rate of 56.  There is  a right bundle-branch block.   PHYSICAL EXAMINATION:  VITALS:  Blood pressure is 112/58, heart rate is  56 and regular.  GENERAL:  This is a well-developed elderly male, in no apparent  distress.  NEUROLOGIC:  Alert and oriented x3.  Normal affect.  LUNGS:  Slightly decreased breath sounds, otherwise clear with normal  respiratory effort.  CARDIOVASCULAR:  Heart regular, S1 and S2.  There is a widely split S2.  There is no S3, no S4.  There is no significant murmur.  EXTREMITIES:  There is no peripheral edema.  There are 1+ posterior  tibial pulses bilaterally.  There is no carotid bruit.  ABDOMEN:  Soft,  nontender.  No hepatosplenomegaly.  NECK:  There is no JVD.  There is no thyromegaly or thyroid nodule.  HEENT:   Normal exam.  EXTREMITIES:  No clubbing or cyanosis.  SKIN:  Normal exam.  MUSCULOSKELETAL:  Normal exam.   ASSESSMENT AND PLAN:  This is an 77 year old with history of atypical  atrial flutter and chronic obstructive pulmonary disease who presents to  Cardiology Clinic for followup after hospitalization for chest pain and  atypical atrial flutter.  1. Chest pain.  This is likely noncardiac in nature.  It was quite      atypical.  The patient did have an adenosine Myoview done recently      that showed no evidence for scar or ischemia.  The patient has      excellent exercise tolerance and no chest pain or shortness of      breath with exertion.  He should continue on his aspirin.  2. Atypical atrial flutter, this is paroxysmal.  The patient is in      sinus rhythm today.  The most recent episode was back in December      when he presented with some lightheadedness.  He is on Lopressor 25      mg twice a day, which he will continue.  We do not have him on      Coumadin, as his CHADS2 score is 1 and this is due to advanced age.      He will continue on aspirin 325 mg daily.  3. Smoking.  I did talk to the patient about stopping smoking.  He      will try nicotine patches.  We will give him the 14 mg a day patch.  4. Lipids.  The patient's LDL is 101 and given no coronary artery      disease this is at goal for him.  5. Erectile dysfunction.  I did prescribe the patient with a low dose      of Viagra 25 mg p.r.n. to try.  We will see the patient back in 6      months.     Marca Ancona, MD  Electronically Signed   DM/MedQ  DD: 01/04/2009  DT: 01/05/2009  Job #: [id number]   cc:   Barry Dienes.  Eloise Harman, M.D.

## 2011-05-08 NOTE — H&P (Signed)
NAME:  Trevor Brooks, Trevor Brooks NO.:  [id number]   MEDICAL RECORD NO.:  [id number]          PATIENT TYPE:  INP   LOCATION:  2034                         FACILITY:  [facility]   PHYSICIAN:  Marca Ancona, MD      DATE OF BIRTH:  01/15/1922   DATE OF ADMISSION:  12/19/2008  DATE OF DISCHARGE:                              HISTORY & PHYSICAL   PRIMARY CARE PHYSICIAN:  Barry Dienes. Eloise Harman, MD   PRIMARY CARDIOLOGIST:  New will be Marca Ancona, MD   CHIEF COMPLAINT:  Weakness and arrhythmia.   HISTORY OF PRESENT ILLNESS:  Mr. Hallum is an 77 year old male with no  history of coronary artery disease.  He was awakened at 3:00 a.m. with  an aching sensation that went from his left thigh up his left side to  his left shoulder and down his left arm.  He took his home dose of  metoprolol, and his wife checked his heart rate and noted it to be lower  a little while later.  He felt better and was able to get back to sleep.  He went to church today, and while sitting in church very early during  the service, he felt lightheaded.  He had presyncope but no syncope.  He  was not aware of any irregular heart beat.  He was not short of breath,  nauseated, or diaphoretic.  The left-sided chest pain had resolved  during the night and did not return.  He told the people at the church  about his symptoms, and he was brought to the emergency room.  In the  emergency room upon arrival, his heart rate was in the 70s with a  systolic blood pressure in the 60s.  He received greater than 1 liter of  IV fluids and his systolic blood pressure is currently 110 with a heart  rate still in the 60s and 70s.  Currently, he is asymptomatic.   PAST MEDICAL HISTORY:  1. Cardiac catheterization in 1996 secondary to chest pain by Dr.      [place].  2. History of stress test, greater than 5 years ago that is reportedly      okay.  3. History of syncope in 2004 with evaluation by Dr. Elease Hashimoto and felt      to be  vasovagal.  4. History of COPD.  5. Peptic ulcer disease and hiatal hernia.  6. History of incomplete right bundle-branch block.  7. History of restless legs syndrome.  8. History of ER visit in 2008 for atypical atrial flutter versus      AFib, discharged from the ER on metoprolol.  9. History of 2 small lung nodules in the right major fissure,      followup recommended (never obtained).   ALLERGIES:  PENICILLIN.   CURRENT MEDICATIONS:  1. Aspirin 81 mg daily.  2. Metoprolol 25 mg b.i.d.   SOCIAL HISTORY:  He lives in [place] with his wife and is retired  from [employer].  He has a greater than 60-pack year history of ongoing  tobacco use but denies alcohol or drug abuse.   FAMILY HISTORY:  His mother and father are both deceased, his father had  a CVA but neither one had known heart disease.  He does have 1 brother  that died with coronary artery disease.   REVIEW OF SYSTEMS:  He has occasional clear nasal discharge and possibly  sinusitis but no sinus infections, fever, or chills recently.  Normally,  he does have orthostatic dizziness when doing things, such as, getting  up out of bed but not every time he gets up out of a chair, and he does  not feel that he is in any danger of falling from this although he does  get up a little bit slower because of it.  He has occasional  arthralgias.  He does not have significant reflux symptoms, hematemesis,  hemoptysis, or melena.  He rarely coughs and denies wheezing.  Full 14-  point review of systems is, otherwise, negative.   PHYSICAL EXAMINATION:  VITAL SIGNS:  Temperature is 96.6, blood pressure  initially 69/47 and 120/60 after IV fluids, heart rate 75, respiratory  rate 15, O2 saturation 98% on room air.  GENERAL:  He is a well-developed, well-nourished white male, in no acute  distress.  HEENT:  Normal.  NECK:  There is no lymphadenopathy, thyromegaly, bruit, or JVD noted.  CARDIOVASCULAR:  His heart is regular in rate and  rhythm without any  significant murmur, rub, or gallop.  He does have occasional premature  beat.  Distal pulses are intact in all 4 extremities and no femoral  bruits are appreciated.  LUNGS:  Essentially clear to auscultation bilaterally.  SKIN:  No rashes or lesions are noted.  ABDOMEN:  Soft and nontender with active bowel sounds.  No  hepatosplenomegaly.  EXTREMITIES:  There is no cyanosis, clubbing, or edema noted.  MUSCULOSKELETAL:  There is no joint deformity or effusions and no spine  or CVA tenderness.  NEUROLOGIC:  He is alert and oriented with cranial nerves II-XII grossly  intact.   Chest x-ray, no acute disease.   EKG #1:  Right bundle-branch block with atypical flutter (less likely  atrial tachycardia with occasional block), rate 74.  EKG #2:  Normal sinus rhythm with left bundle-branch block, frequent  PACs and possibly multiple atrial morphology present.   LABORATORY VALUES:  Hemoglobin 14.3, hematocrit 49.1, WBCs 9.9,  platelets 239.  Sodium 138, potassium 4.4, chloride 100, CO2 28, BUN 22,  creatinine 1.62, glucose 104.  GFR 30.  BNP 233.  CK-MB 138/4.3 with a  troponin of 0.03, point-of-care markers negative x1.   IMPRESSION:  Mr. Pask was seen today by Dr. Shirlee Latch.  He has a history  of paroxysmal atrial fibrillation and chronic obstructive pulmonary  disease who presented with an episode of left-sided body aches last  night and lightheadedness at church today.  In the emergency room, his  initial blood pressure was low at 69/47, but his heart rate was in the  70s.  It was atypical atrial flutter.  His systolic blood pressure  increased to 120s with intravenous fluid almost immediately.  1. Lightheadedness/hypotension:  The cause is unclear.  He is not      tachycardic or bradycardic by telemetry or by symptoms.  He did      have hypotension in the emergency room.  There is a possibility of      dehydration but no reason for it.  He has no evidence of  infection.      He will be monitored on telemetry overnight with an EKG  tonight and      in a.m.  He will be watched carefully for tachyarrhythmias and      bradyarrhythmias.  We will check an echocardiogram.  We will finish      up the second bag of normal saline at 75 mL an hour.  His BNP is up      slightly but he is euvolemic by exam, and he will be followed      closely.  2. Arrhythmia:  By EKG, he initially appears to have been in atypical      atrial flutter with a heart rate in the 70s.  Now, he is in sinus      rhythm with premature atrial contractions.  He also had atypical      flutter in September 2008.  His CHADS score is 1 with an age      greater than 63.  He will be continued on aspirin 325 mg daily and      keep him on metoprolol at 12.5 mg q.8 h.  A TSH is pending.  3. Chest pain:  It is quite atypical.  His risk factors are age and      smoking.  We will cycle cardiac enzymes and check a lipid profile.      If all of the above is within normal limits, an outpatient Myoview      can be done.  Smoking cessation will be encouraged, and a consult      is ordered.      Theodore Demark, PA-C      Marca Ancona, MD  Electronically Signed    RB/MEDQ  D:  12/19/2008  T:  12/20/2008  Job:  [id number]

## 2011-05-09 ENCOUNTER — Ambulatory Visit (INDEPENDENT_AMBULATORY_CARE_PROVIDER_SITE_OTHER): Payer: Medicare PPO | Admitting: *Deleted

## 2011-05-09 DIAGNOSIS — I4892 Unspecified atrial flutter: Secondary | ICD-10-CM

## 2011-05-09 DIAGNOSIS — I4891 Unspecified atrial fibrillation: Secondary | ICD-10-CM

## 2011-05-11 NOTE — Discharge Summary (Signed)
NAME:  Trevor Brooks, Trevor Brooks NO.:  [id number]   MEDICAL RECORD NO.:  [id number]                   PATIENT TYPE:  OBV   LOCATION:  3706                                 FACILITY:  [facility]   PHYSICIAN:  Barry Dienes. Eloise Harman, M.D.            DATE OF BIRTH:  08-Apr-1922   DATE OF ADMISSION:  04/04/2003  DATE OF DISCHARGE:  04/05/2003                                 DISCHARGE SUMMARY   HISTORY OF PRESENT ILLNESS:  The patient is a 77 year old white male who is  postoperative day four from a recurrent left inguinal hernia repair who is  in his usual state of health watching T.V. when he began to feel dizzy.  He  went to the bathroom and had a bowel movement and the dizziness worsened.  Family members found him on the bathroom floor without acute injury and  noted that he had a slow pulse.  He was briefly confused and brought to the  emergency room for evaluation.  He denied symptoms of chest pain,  palpitations, or shortness of breath.   MEDICATIONS:  He was not on any medications prior to admission.   PHYSICAL EXAMINATION:  VITAL SIGNS:  Blood pressure 125/71, pulse 79,  respiratory rate 12, temperature 97.0.  Oxygen saturation 99% on room air.  GENERAL:  He is a well-developed, well-nourished white male who is alert and  oriented x 3.  HEENT:  Within normal limits.  NECK:  Without jugular venous distention or carotid bruit.  CHEST:  Clear to auscultation.  HEART:  Regular rate and rhythm.  S1 and S2 were present without murmurs,  rubs, or gallops.  ABDOMEN:  Normal bowel sounds with no tenderness to light palpation.  EXTREMITIES:  Without clubbing, cyanosis, or edema.   LABORATORY DATA:  Serum sodium 135, potassium 4.8, chloride 102, CO2 29, BUN  22, creatinine 1.3, glucose 103.  White blood cell count 11.1.  Hemoglobin  13.6, platelet count 218,000.  CK 107.   EKG showed normal sinus rhythm, right bundle branch block, nonspecific ST  segment and T-wave  abnormalities in the inferolateral leads.  Chest x-ray on  March 30, 2003 showed nodular densities on the mid-right lung with a CAT scan  showing benign-appearing nodular densities in the mid-right lung with a  recommendation to repeat a CAT scan in approximately six months.   HOSPITAL COURSE:  The patient was admitted to telemetry bed for 24-hour  observation of what appeared to be vasovagal syncope.  He had a head CT scan  without contrast.  It showed no acute abnormality and telemetry for 24 hours  again showed no arrhythmia.  He was able to walk about the room during the  day with no difficulty.  He was seen by a cardiology consult (Dr. Vesta Mixer) who agreed that his syncope was probably due to vasovagal etiology  and agreed that an outpatient workup would be indicated.  PROCEDURE:  CT scan of the head.   COMPLICATIONS:  None.   CONDITION ON DISCHARGE:  His head is supine without shortness of breath,  chest pain, palpitations, or dizziness.  He is eating well and does not  complain of constipation or diarrhea.  His blood pressure was 130/80, pulse  70 at last measured prior to discharge.    DISCHARGE DIAGNOSES:  1. Syncope.  2. Chronic obstructive pulmonary disease.  3. History of peptic ulcer disease.  4. Hiatal hernia.  5. Right lung nodules.  6. Right bundle branch block.   DISCHARGE MEDICATIONS:  Ecotrin 81 mg p.o. q.d.   FOLLOW UP:  He should be seen by Dr. Vesta Mixer at [facility] within two weeks of discharge and can call [phone] to  schedule this appointment.  He should be seen by Dr. Barry Dienes. Eloise Harman in  one to two months for an annual physical exam and should call [phone] to  schedule this visit.                                                Barry Dienes Eloise Harman, M.D.    DGP/MEDQ  D:  04/05/2003  T:  04/05/2003  Job:  [id number]   cc:   Anselm Pancoast. Zachery Dakins, M.D.  [address]  [place]  [place] [address]   Fax: [phone]   Llana Aliment. Malon Kindle., M.D.  [address], [address]  [place]  [place] [address]  Fax: [phone]

## 2011-05-11 NOTE — Consult Note (Signed)
NAME:  Trevor Brooks, DUFRANE NO.:  [id number]   MEDICAL RECORD NO.:  [id number]                   PATIENT TYPE:  OBV   LOCATION:  3706                                 FACILITY:  [facility]   PHYSICIAN:  Vesta Mixer, M.D.              DATE OF BIRTH:  1922-07-23   DATE OF CONSULTATION:  04/05/2003  DATE OF DISCHARGE:                                   CONSULTATION   REASON FOR CONSULTATION:  The patient is a 77 year old gentleman with a  history of a left inguinal hernia.  He has a history of chest pain in the  past.  He was admitted for an episode of syncope.  The patient saw Dr. Peter  M. [place] many years ago.  He had a chest pain and abnormal stress test.  He  had an unremarkable cardiac catheterization at that time.  He has not been  seen in our office since that time.  The patient recently had a left  inguinal hernia repair.  He did fairly well following the surgery until  Sunday morning when he had some lightheadedness.  He reports feeling  somewhat lightheaded.  He went into the bathroom and when he stood up he got  very dizzy.  He made up to the bed where he collapsed for an unknown period  of time.  He did not have any bowel or bladder incontinence.  He denied any  chest pain or shortness of breath, tachypalpitations.  He was aroused by  family member and did not have any postictal symptoms.  The patient was  somewhat groggy but recovered fairly completely.  He was brought to the  emergency room and his workup was fairly unremarkable.  He was admitted for  further evaluation.   PAST MEDICAL HISTORY:  Hernia repair.   MEDICATIONS:  None.   ALLERGIES:  1. PENICILLIN.  2. History of gastric surgery.  3. History of chest pain.  4. Normal cardiac catheterization in 1996.   SOCIAL HISTORY:  The patient smokes.  He does not drink any alcohol.   FAMILY HISTORY:  Significant for coronary artery disease.   REVIEW OF SYMPTOMS:  Unremarkable.   PHYSICAL EXAMINATION:  GENERAL:  He is an elderly gentleman in no acute  distress.  He is alert and oriented x 3 and his mood and affect are normal.  VITAL SIGNS:  His blood pressure is 130/70 with a heart rate of 60.  NECK:  There are 2+ carotids.  He has no bruits.  There is no JVD and no  thyromegaly.  LUNGS:  Clear to auscultation.  HEART:  Regular rate.  S1 and S2.  He has a 1 to 2/6 systolic ejection  murmur of the left sternal border.  ABDOMEN:  Reveals good bowel sounds and is nontender.  EXTREMITIES:  There is no clubbing, cyanosis, or edema.  His calves are  nontender.  NEUROLOGICAL:  Nonfocal.  LABORATORY DATA:  His EKG reveals normal sinus rhythm.  He has an incomplete  right bundle branch block.  His ST and T-wave changes possibly associated  with the bundle branch block versus lateral ischemia.   His cardiac enzymes are negative x 2.  His electrolytes are within normal  limits.   IMPRESSION:  The patient presents with a syncopal episode that is probably  related to orthostatic hypotension versus a vasovagal episode.  Symptoms are  negative.  He has been in sinus rhythm since admission.  He is currently  stable and has not had any signs or symptoms of congestive heart failure or  angina.  In addition, he had a normal heart catheterization back in 1996.  We will continue workup of his syncope as an outpatient.  He will need a  stress Cardiolite study and we will get an echocardiogram.  Further  evaluation will be per Dr. Barry Dienes. [place].                                               Vesta Mixer, M.D.    PJN/MEDQ  D:  04/05/2003  T:  04/05/2003  Job:  [id number]   cc:   Barry Dienes. Eloise Harman, M.D.  [address]  [place]  [place] [address]  Fax: [phone]   Anselm Pancoast. Zachery Dakins, M.D.  [address], [address]  [place]  [place] [address]  Fax: [phone]

## 2011-05-11 NOTE — Op Note (Signed)
NAME:  Trevor Brooks, Trevor Brooks                       ACCOUNT NO.:  [id number]   MEDICAL RECORD NO.:  [id number]                   PATIENT TYPE:  AMB   LOCATION:  DSC                                  FACILITY:  [facility]   PHYSICIAN:  Anselm Pancoast. Zachery Dakins, M.D.          DATE OF BIRTH:  Apr 06, 1922   DATE OF PROCEDURE:  03/31/2003  DATE OF DISCHARGE:                                 OPERATIVE REPORT   PREOPERATIVE DIAGNOSIS:  Recurrent left inguinal hernia.   POSTOPERATIVE DIAGNOSIS:  Recurrent left inguinal hernia, originally  [place].  I switched him to a mesh reinforced Lichtenstein type repair.   SURGEON:  Carvel Huskins. Zachery Dakins, M.D.   ANESTHESIA:  Local with heavy sedation.   INDICATIONS FOR PROCEDURE:  The patient is a 77 year old male who returned  to me.  I had repaired a bleeding ulcer about 15 years ago and about 30  years ago, he had had bilateral inguinal hernias repaired by Dr. Cathlean Cower through kind of a transverse high abdominal incision.  I was not sure  of the type of repair but he had an obvious bulge laterally and I questioned  whether this was an old Arboriculturist.  I did a flexible sigmoidoscopy as  he was stating he was having some colon irregularities and kind of funny-  looking stool sometimes and could not see anything, but there was a lot of  kinking, etc. up around the sigmoid area.  The patient's stools were  hemoccult negative.   DESCRIPTION OF PROCEDURE:  The patient was taken to the operative suite.  We  gave him 400 mg of Cipro.  He is allergic to penicillin.  Then induction of  sedation and I shaved the left groin and then prepped him with Betadine  solution and draped him in a sterile manner.  I made the incision kind of in  the standard inguinal approach not the transverse kind of as high as the  previous and then dissected down the cord structures were in their native  subcutaneous tissue in a previous [place] repair.  I carefully dissected  the  cord structures from the subcutaneous tissue identified where it could  be brought through the fascia and then opened then opened the medial aspect  of the fascia very carefully.  There was an indirect component coming out  through the external ring area that was separated from the cord structures.  I opened the sac and there was a sliding sigmoid colon and this is obviously  what was causing the bowel stoppage.  I opened this, kind of freed the  colon, placed it back in the peritoneal cavity and then did a high sac  ligation under direct vision with a 0 Surgilon.  I used a second stitch of  the same material to try to reinforce the peritoneal closure.  Next as far  as on how to repair this, I elected to kind of open the  external oblique  medially so I could use a piece of Prolene mesh within the standard area  kind of on top of the internal oblique and this was sutured in starting at  the symphysis pubis with 2-0 Prolenes sutured to the inguinal ligament  inferiorly, the two tails sutured together laterally.  I then used  interrupted 2-0 Prolene to suture the area medially and the mesh was lying  flat, not under tension.  Next, I closed the external oblique over the cord  structures so he has a standard Cytogeneticist now.  I think I was able  to save the external oblique and hopefully it is not being pressed upon with  the mesh and the subcutaneous tissue was closed with interrupted 3-0 Vicryl  and 5-0 Dexon subcuticular and then benzoin and Steri-Strips on the skin.  The testicle was in its normal position and he will be released after a  short stay in the recovery room.  Hopefully, he will be able to void.  The  patient knows that if he is not able to void to  bring him back to the emergency room so that we could reinsert a Foley  catheter and we will see him back in the office in approximately a week.  I  am also going to keep him on liquids for a couple of days until he actually   has a good bowel movement since there was a little manipulation of the  sigmoid colon.                                                Anselm Pancoast. Zachery Dakins, M.D.    WJW/MEDQ  D:  03/31/2003  T:  04/01/2003  Job:  [id number]   cc:   Fayrene Fearing L. Malon Kindle., M.D.  [address], [address]  [place]  [place] [address]  Fax: [phone]

## 2011-05-11 NOTE — H&P (Signed)
NAME:  Trevor Brooks, Trevor Brooks NO.:  [id number]   MEDICAL RECORD NO.:  [id number]                   PATIENT TYPE:  OBV   LOCATION:  3706                                 FACILITY:  [facility]   PHYSICIAN:  Gwen Pounds, M.D.                 DATE OF BIRTH:  [birth year]   DATE OF ADMISSION:  04/04/2003  DATE OF DISCHARGE:                                HISTORY & PHYSICAL   PRIMARY CARE PHYSICIAN:  Barry Dienes. Eloise Harman, M.D.   GASTROENTEROLOGIST:  Llana Aliment. Randa Evens, M.D.   SURGEON:  Lorenz Donley. Zachery Dakins, M.D.   CHIEF COMPLAINT:  Syncope.   HISTORY OF PRESENT ILLNESS:  This is a 77 year old male postoperative #4 for  recurrent left inguinal hernia repair per Dr. Zachery Dakins who was in his usual  state of health watching the Masters today when he began to feel dizzy.  He  went and had a bowel movement when the dizziness worsened.  There was no  straining.  There was no melena or hematochezia.  He then went to the  bedroom where he collapsed face first on the bed without any trauma.  He was  down for a questionable period of time.  Family estimates anywhere from 2-20  minutes.  Daughter, who is a Engineer, civil (consulting), checked his pulse and said it was very  difficult to find one and it was thready.  Also described not taking too  many breaths.  When he finally came to, he was diffused and oriented and had  labored breathing.  He was brought to the ED by EMS and is back to normal  here in the emergency room.  Workup in the emergency room again was  unremarkable.  I was called to observe.  The patient has not had any pain  pills since surgery, has had no complaints and is really feeling fine.   PAST MEDICAL HISTORY:  1. Left hernia repair x2, last one March 31, 2003.  2. Hiatal hernia.  3. Gastric surgery secondary to peptic ulcer disease.  4. Superficial moles, noncancerous.  5. Questionable chronic obstructive pulmonary disease.  6. Negative heart catheterization in 1996.   ALLERGIES:  PENICILLIN.   MEDICATIONS:  None.   SOCIAL HISTORY:  He is married.  He still smokes.  He drinks coffee.  No  alcohol.   FAMILY HISTORY:  Brother died of coronary disease.  Dad died of a stroke.  Brother died of lung cancer and son died of lung cancer.   REVIEW OF SYSTEMS:  He describes having postural dizziness for one to two  months.  Tonight's dizziness got worse with posture and worse with a bowel  movement and this is questionable vasovagal.  Again, there was nothing  unusual about his bowel movements.  There has been nothing unusual about his  urination.  He denies any ear, nose and throat abnormalities.  He denies any  head or neck trauma.  He denies any headaches currently.  Denies any  abdominal issues or lower extremity issues.  All organ systems were reviewed  and negative.   PHYSICAL EXAMINATION:  VITAL SIGNS:  Temperature 97.0, blood pressure  125/71, heart rate 79, respiratory rate 12, saturation 99% on room air.  GENERAL:  Alert and oriented x3.  No acute distress.  Cranial nerves II-XII  grossly intact.  NECK:  No bruits.  No lymphadenopathy.  CARDIAC:  Regular without murmurs.  PULMONARY:  Clear to auscultation bilaterally.  ABDOMEN:  Midline surgical scar noted.  EXTREMITIES:  No edema.  NEUROLOGICAL:  Intact throughout.  Power is normal throughout upper and  lower extremities.  Reflexes are intact.  Toes are downgoing.  SKIN:  There is loss of pigment on his scalp, forehead and fingers.  Hernia  scar is currently __________  .   LABORATORY DATA:  C-MET and CBC within normal limits on March 30, 2003, and a  chest x-ray on March 30, 2003, showed a nodular density.  The family says it  was from his external pedunculated moles.  CT of his chest showed benign  nodular densities, and they recommended repeat CT in six months.   Laboratory data today shows  normal PT/INR.  Sodium 135, potassium 4.8,  chloride 102, bicarbonate 29, BUN 22, creatinine 1.3.   Glucose 103, CK 107.  White blood cell count 11.1 with 83% segs.  Hemoglobin 13.6, platelet count  219,000.  EKG shows normal sinus rhythm, right bundle branch block, inferior  lateral ST changes.   ASSESSMENT:  This is a 77 year old male with probable vasovagal syncope but  because we do not know how long he was down, there are ST changes in the  inferior lateral leads, and he had some previous presyncopal events.  I  believe it best that we at least observe him overnight.   PLAN:  1. Observation for 23 hours.  2. Check cranial CT without contrast to rule out CVA.  3. Place on telemetry monitoring to see if there is any underlying rhythm     disturbances.  4. Check one more set of CKs and troponin-I in the morning.  5. Check orthostatics in the a.m.  6. If above is negative, Dr. Jarold Motto can decide further workup either     inpatient or outpatient.  If outpatient, the workup done includes 2-D     echocardiogram, MRA, MRI, carotids, etc.  7. Nodularity density.  Repeat chest CT in six months.  8. Check urinalysis.                                               Gwen Pounds, M.D.    JMR/MEDQ  D:  04/04/2003  T:  04/05/2003  Job:  [id number]   cc:   Barry Dienes. Eloise Harman, M.D.  [address]  [place]  [place] [address]  Fax: [phone]   Llana Aliment. Malon Kindle., M.D.  [address]  [place]  [place] [address]  Fax: [phone]   Trevor Brooks. Zachery Dakins, M.D.  [address]  [place]  [place] [address]  Fax: [phone]

## 2011-05-19 ENCOUNTER — Emergency Department (HOSPITAL_COMMUNITY): Payer: Medicare HMO

## 2011-05-19 ENCOUNTER — Inpatient Hospital Stay (HOSPITAL_COMMUNITY)
Admission: EM | Admit: 2011-05-19 | Discharge: 2011-06-04 | DRG: 027 | Disposition: A | Discharge status: Home Health | Payer: Medicare HMO | Attending: Neurosurgery | Admitting: Neurosurgery

## 2011-05-19 DIAGNOSIS — J4489 Other specified chronic obstructive pulmonary disease: Secondary | ICD-10-CM | POA: Diagnosis present

## 2011-05-19 DIAGNOSIS — Y92009 Unspecified place in unspecified non-institutional (private) residence as the place of occurrence of the external cause: Secondary | ICD-10-CM

## 2011-05-19 DIAGNOSIS — I10 Essential (primary) hypertension: Secondary | ICD-10-CM | POA: Diagnosis present

## 2011-05-19 DIAGNOSIS — W19XXXA Unspecified fall, initial encounter: Secondary | ICD-10-CM | POA: Diagnosis present

## 2011-05-19 DIAGNOSIS — Z7901 Long term (current) use of anticoagulants: Secondary | ICD-10-CM

## 2011-05-19 DIAGNOSIS — S065X0A Traumatic subdural hemorrhage without loss of consciousness, initial encounter: Principal | ICD-10-CM | POA: Diagnosis present

## 2011-05-19 DIAGNOSIS — G2581 Restless legs syndrome: Secondary | ICD-10-CM | POA: Diagnosis present

## 2011-05-19 DIAGNOSIS — J449 Chronic obstructive pulmonary disease, unspecified: Secondary | ICD-10-CM | POA: Diagnosis present

## 2011-05-19 DIAGNOSIS — I4891 Unspecified atrial fibrillation: Secondary | ICD-10-CM | POA: Diagnosis present

## 2011-05-19 LAB — DIFFERENTIAL
Basophils Absolute: 0.1 10*3/uL (ref 0.0–0.1)
Basophils Relative: 1 % (ref 0–1)
Eosinophils Absolute: 0.2 10*3/uL (ref 0.0–0.7)
Eosinophils Relative: 2 % (ref 0–5)
Lymphocytes Relative: 14 % (ref 12–46)
Lymphs Abs: 1.3 10*3/uL (ref 0.7–4.0)
Monocytes Absolute: 0.6 10*3/uL (ref 0.1–1.0)
Monocytes Relative: 7 % (ref 3–12)
Neutro Abs: 7 10*3/uL (ref 1.7–7.7)
Neutrophils Relative %: 77 % (ref 43–77)

## 2011-05-19 LAB — COMPREHENSIVE METABOLIC PANEL
ALT: 16 U/L (ref 0–53)
ALT: 19 U/L (ref 0–53)
Albumin: 4 g/dL (ref 3.5–5.2)
Alkaline Phosphatase: 87 U/L (ref 39–117)
BUN: 20 mg/dL (ref 6–23)
BUN: 22 mg/dL (ref 6–23)
CO2: 30 mEq/L (ref 19–32)
Calcium: 9.8 mg/dL (ref 8.4–10.5)
Chloride: 98 mEq/L (ref 96–112)
GFR calc non Af Amer: 60 mL/min — ABNORMAL LOW (ref >60)
Glucose, Bld: 100 mg/dL — ABNORMAL HIGH (ref 70–99)
Glucose, Bld: 162 mg/dL — ABNORMAL HIGH (ref 70–99)
Potassium: 4.1 mEq/L (ref 3.5–5.1)
Potassium: 3.8 mEq/L (ref 3.5–5.1)
Sodium: 136 mEq/L (ref 135–145)
Sodium: 138 mEq/L (ref 135–145)
Total Bilirubin: 0.7 mg/dL (ref 0.3–1.2)
Total Protein: 6.7 g/dL (ref 6.0–8.3)
Total Protein: 7.1 g/dL (ref 6.0–8.3)

## 2011-05-19 LAB — CBC
HCT: 39.4 % (ref 39.0–52.0)
Hemoglobin: 13.7 g/dL (ref 13.0–17.0)
MCHC: 33.6 g/dL (ref 30.0–36.0)
MCHC: 32 g/dL (ref 30.0–36.0)
Platelets: 179 10*3/uL (ref 150–400)
Platelets: 182 10*3/uL (ref 150–400)
RDW: 15 % (ref 11.5–15.5)
RDW: 15 % (ref 11.5–15.5)
WBC: 14.8 10*3/uL — ABNORMAL HIGH (ref 4.0–10.5)

## 2011-05-19 LAB — PROTIME-INR
INR: 2.09 — ABNORMAL HIGH (ref 0.00–1.49)
INR: 1.24 (ref 0.00–1.49)
INR: 1.27 (ref 0.00–1.49)
Prothrombin Time: 23.6 seconds — ABNORMAL HIGH (ref 11.6–15.2)
Prothrombin Time: 14.9 seconds (ref 11.6–15.2)
Prothrombin Time: 15.8 seconds — ABNORMAL HIGH (ref 11.6–15.2)
Prothrombin Time: 16.1 seconds — ABNORMAL HIGH (ref 11.6–15.2)

## 2011-05-19 LAB — URINALYSIS, ROUTINE W REFLEX MICROSCOPIC
Glucose, UA: NEGATIVE mg/dL
Leukocytes, UA: NEGATIVE
Protein, ur: NEGATIVE mg/dL
Specific Gravity, Urine: 1.01 (ref 1.005–1.030)
pH: 6.5 (ref 5.0–8.0)

## 2011-05-19 LAB — CK TOTAL AND CKMB (NOT AT ARMC)
CK, MB: 4.3 ng/mL — ABNORMAL HIGH (ref 0.3–4.0)
Total CK: 159 U/L (ref 7–232)

## 2011-05-19 LAB — ABO/RH: ABO/RH(D): O POS

## 2011-05-19 LAB — GLUCOSE, CAPILLARY: Glucose-Capillary: 144 mg/dL — ABNORMAL HIGH (ref 70–99)

## 2011-05-19 LAB — URINE MICROSCOPIC-ADD ON

## 2011-05-20 ENCOUNTER — Inpatient Hospital Stay (HOSPITAL_COMMUNITY): Payer: Medicare HMO

## 2011-05-20 LAB — COMPREHENSIVE METABOLIC PANEL
ALT: 17 U/L (ref 0–53)
Alkaline Phosphatase: 78 U/L (ref 39–117)
CO2: 27 mEq/L (ref 19–32)
Calcium: 9 mg/dL (ref 8.4–10.5)
GFR calc non Af Amer: 58 mL/min — ABNORMAL LOW (ref >60)
Glucose, Bld: 148 mg/dL — ABNORMAL HIGH (ref 70–99)
Sodium: 139 mEq/L (ref 135–145)
Total Bilirubin: 0.6 mg/dL (ref 0.3–1.2)

## 2011-05-20 LAB — GLUCOSE, CAPILLARY
Glucose-Capillary: 153 mg/dL — ABNORMAL HIGH (ref 70–99)
Glucose-Capillary: 135 mg/dL — ABNORMAL HIGH (ref 70–99)

## 2011-05-20 LAB — CBC
HCT: 36.9 % — ABNORMAL LOW (ref 39.0–52.0)
Hemoglobin: 12.2 g/dL — ABNORMAL LOW (ref 13.0–17.0)
MCH: 28.7 pg (ref 26.0–34.0)
MCHC: 33.1 g/dL (ref 30.0–36.0)
MCV: 86.8 fL (ref 78.0–100.0)
RBC: 4.25 MIL/uL (ref 4.22–5.81)

## 2011-05-20 LAB — URINE CULTURE
Colony Count: NO GROWTH
Culture  Setup Time: 201205262020
Culture: NO GROWTH

## 2011-05-21 DIAGNOSIS — I4891 Unspecified atrial fibrillation: Secondary | ICD-10-CM

## 2011-05-21 LAB — PREPARE FRESH FROZEN PLASMA: Unit division: 0

## 2011-05-21 LAB — PROTIME-INR
INR: 1.22 (ref 0.00–1.49)
Prothrombin Time: 15.6 seconds — ABNORMAL HIGH (ref 11.6–15.2)

## 2011-05-21 LAB — GLUCOSE, CAPILLARY
Glucose-Capillary: 110 mg/dL — ABNORMAL HIGH (ref 70–99)
Glucose-Capillary: 142 mg/dL — ABNORMAL HIGH (ref 70–99)

## 2011-05-22 ENCOUNTER — Inpatient Hospital Stay (HOSPITAL_COMMUNITY): Payer: Medicare HMO

## 2011-05-23 LAB — GLUCOSE, CAPILLARY
Glucose-Capillary: 111 mg/dL — ABNORMAL HIGH (ref 70–99)
Glucose-Capillary: 177 mg/dL — ABNORMAL HIGH (ref 70–99)
Glucose-Capillary: 142 mg/dL — ABNORMAL HIGH (ref 70–99)

## 2011-05-24 ENCOUNTER — Inpatient Hospital Stay (HOSPITAL_COMMUNITY): Payer: Medicare HMO

## 2011-05-24 LAB — BASIC METABOLIC PANEL
BUN: 18 mg/dL (ref 6–23)
Calcium: 9 mg/dL (ref 8.4–10.5)
Creatinine, Ser: 0.67 mg/dL (ref 0.4–1.5)
GFR calc Af Amer: >60 mL/min (ref >60)

## 2011-05-24 LAB — GLUCOSE, CAPILLARY: Glucose-Capillary: 131 mg/dL — ABNORMAL HIGH (ref 70–99)

## 2011-05-24 NOTE — Op Note (Signed)
  NAME:  Trevor Brooks, Trevor Brooks NO.:  [id number]  MEDICAL RECORD NO.:  [id number]           PATIENT TYPE:  I  LOCATION:  3107                         FACILITY:  [facility]  PHYSICIAN:  Kathaleen Maser. Kevon Tench, M.D.    DATE OF BIRTH:  05/03/1922  DATE OF PROCEDURE:  05/19/2011 DATE OF DISCHARGE:                              OPERATIVE REPORT   SERVICE:  Neurosurgery.  PREOPERATIVE DIAGNOSIS:  Bilateral subacute subdural hematoma.  POSTOPERATIVE DIAGNOSIS:  Bilateral subacute subdural hematoma.  PROCEDURE NOTE:  Bilateral frontal parietal burr hole evacuation of subdural hematomas, bilateral placement of subdural drains.  SURGEON:  Kathaleen Maser. Yarithza Mink, M.D.  ANESTHESIA:  General endotracheal.  INDICATION:  [name] is a 77 year old male with history of progressive headaches and gait instability.  Workup demonstrates evidence of large bilateral subacute subdural hematomas with marked mass effect.  The patient presents now for burr hole evacuation in hopes of improving his symptoms.  OPERATION:  The patient was taken to the operating room table and placed in the supine position.  After adequate level of anesthesia was achieved, the patient's scalp was prepped and draped sterilely. Bilateral incisions were made in the frontal and parietal region.  This was carried down sharply to the pericranium.  Burr holes were made using high-speed drill.  Dura was coagulated.  Dura was then cut in a cruciate fashion and the dural edges were burned back to the edges of the bur hole.  A great deal of dark liquefied blood was released under pressure. This was then irrigated clear.  The 10-mm Blake drains were then passed from the parietal holes to the frontal region under direct vision through the frontal burr hole.  The wounds were then both irrigated one final time and then closed in typical fashion.  Sterile dressings were applied.  There were no complications.  The patient tolerated the procedure  well and he returns to the recovery room postoperative.          ______________________________ Kathaleen Maser. Domenic Schoenberger, M.D.     HAP/MEDQ  D:  05/19/2011  T:  05/20/2011  Job:  [id number]  Electronically Signed by Julio Sicks M.D. on 05/24/2011 01:15:04 PM

## 2011-05-24 NOTE — Consult Note (Signed)
NAME:  Trevor Brooks, Trevor Brooks NO.:  [id number]  MEDICAL RECORD NO.:  [id number]           PATIENT TYPE:  I  LOCATION:  3107                         FACILITY:  [facility]  PHYSICIAN:  Cassell Clement, M.D. DATE OF BIRTH:  [birth year]  DATE OF CONSULTATION:  05/21/2011 DATE OF DISCHARGE:                                CONSULTATION   CARDIOLOGIST:  Marca Ancona, MD  HISTORY OF PRESENT ILLNESS:  We were asked to see this pleasant 77 year old gentleman in regard to his atrial fibrillation.  The patient has a history of recurrent atrial flutter fibrillation.  He had prior TEE cardioversion on November 16, 2009 by Dr. Marca Ancona, however, he was not able to hold his rhythm and subsequently reverted back to atrial fibrillation.  He has been treated subsequently with rate control and chronic warfarin.  His warfarin is followed at the [facility].  He has been on diltiazem CD 180 mg daily and metoprolol succinate 25 mg twice a day as well as warfarin.  The patient had been doing well.  About 1 month ago, he fell in the bathroom and hit his head.  At that time, there did not appear to be any significant injury. However, over the past month, he has developed gradual worsening headaches and gait instability which led to his admission on May 19, 2011 with the finding of large bilateral subdural hematomas on CT scan. Dr. Jordan Likes saw the patient for neurosurgery consultation and after discussion with the family proceeded with bilateral bur holes.  The patient is now postoperative.  He has been on intravenous diltiazem drip to control his ventricular response.  We are asked to help with managing the conversion back to oral meds.  Of note is the fact that the patient does not have any known coronary disease.  He had cardiac catheterization in 1996 by Dr. [place] which was negative.  He had an adenosine Myoview in January 2010 which showed no ischemia and his ejection fraction  was normal at 64%.  He has not been experiencing any chest pain.  There is no history of congestive heart failure.  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  ALLERGIES:  The patient is allergic to PENICILLIN.  FAMILY HISTORY:  Noncontributory.  SOCIAL HISTORY:  He is married.  He is very active and independent.  REVIEW OF SYSTEMS:  All negative except as noted in present illness.  PHYSICAL EXAMINATION:  VITAL SIGNS:  His blood pressure is 135/90, pulse is 115 and irregularly irregular. GENERAL APPEARANCE:  An elderly gentleman who is alert.  He has large dressings over bilateral bur holes. HEENT:  Pupils were equal.  Sclerae are clear.  Mouth and pharynx normal. NECK:  Carotids, no bruits.  Thyroid not enlarged.  There is no lymphadenopathy. CHEST:  Coarse rhonchi bilaterally. HEART:  No murmur, gallop, rub, or click.  Rhythm is irregular. ABDOMEN:  No hepatosplenomegaly or masses.  The abdomen is nontender. EXTREMITIES:  No phlebitis or edema.  Pedal pulses are good.  IMAGING:  His electrocardiogram done on admission on  May 19, 2011 shows atrial fibrillation with inferolateral ST-segment depression and a right bundle-branch block pattern.  The QRS is normal.  The chest x-ray on admission showed normal heart size and COPD.  IMPRESSION: 1. Established chronic atrial fibrillation, presently on IV diltiazem. 2. Recent subdural bilateral hematomas.  DISPOSITION:  We will start to transition him back to oral meds.  We will start with diltiazem 60 mg p.o. every 6 hours with parameters.  We will subsequently switch him back to a long-acting diltiazem prior to discharge after his swallowing is more secure.  We will hold off on restarting metoprolol for now but he may need that later as activity increases.  He will not be on any Coumadin in the short term and he may not need Coumadin again in view of his  possible increased fall risk.  We will have him followup with Dr. Shirlee Latch in the office after discharge.  Many thanks for the opportunity to see this pleasant gentleman.          ______________________________ Cassell Clement, M.D.     TB/MEDQ  D:  05/21/2011  T:  05/21/2011  Job:  [id number]  cc:   Marca Ancona, MD  Electronically Signed by Cassell Clement M.D. on 05/24/2011 12:48:49 PM

## 2011-05-24 NOTE — H&P (Signed)
NAME:  Trevor Brooks, Trevor Brooks NO.:  [id number]  MEDICAL RECORD NO.:  [id number]           PATIENT TYPE:  I  LOCATION:  3107                         FACILITY:  [facility]  PHYSICIAN:  Kathaleen Maser. Zan Triska, M.D.    DATE OF BIRTH:  [birth year]  DATE OF ADMISSION:  05/19/2011 DATE OF DISCHARGE:                             HISTORY & PHYSICAL   ADMITTING DIAGNOSIS:  Bilateral subdural hematomas.  HISTORY OF PRESENT ILLNESS:  Trevor Brooks is an 77 year old male who is chronically kept on Coumadin for prophylactic treatment of his atrial fibrillation.  The patient has been in his usual good state of health until the last few days where his wife has noted that he has become progressively unsteady.  He is slumping over and sometimes falling to the ground.  The patient also noted the progressive onset of bifrontal headache.  He denies any seizure.  He denies any weakness.  He denies any sensory loss.  He has no known history of major trauma either recently or remotely.  PAST MEDICAL HISTORY:  Notable for history of hypertension and atrial fibrillation.  Otherwise, the patient is in good general health.  CURRENT MEDICATIONS:  Diltiazem and Coumadin.  ALLERGIES:  PENICILLIN.  FAMILY HISTORY:  Noncontributory.  SOCIAL HISTORY:  The patient is married.  He and his wife both live at home.  They are both very independent.  He is very physically active.  REVIEW OF SYSTEMS:  Negative.  PHYSICAL EXAMINATION:  He is awake.  He is aware.  He is oriented x3/3. His speech is fluent and clear.  His content is good.  Cranial nerve function reveals his visual acuity and visual fields to be grossly intact.  Extraocular movements are full.  Facial movement is symmetric bilaterally.  Tongue protrudes in the midline.  Palate elevates in the midline.  Shoulders shrug equally.  Extremity examination reveals normal strength in both upper and lower extremities.  His toe may be slightly increased in the  lower extremities.  Sensory examination is nonfocal. Deep tendon reflexes are normoactive.  There are no long track signs. Gait is not tested.  Examination of head, ears, eyes, nose, and throat demonstrates no evidence of trauma, bony abnormality, or other pathology.  External auditory canals, nasopharynx, and oropharynx all look normal.  Neck is reasonably soft and supple.  Examination of his thoracic and lumbar spine are negative.  Chest and abdominal examination is benign. Strength is free of injury or deformity.  I reviewed the patient's CT scan of his brain.  This demonstrates evidence of very large bilateral subacute to chronic subdural hematomas with marked mass effect upon the underlying brain.  This patient is suffering symptoms secondary to his bilateral subdural hematomas.  I have discussed options for management with both operative and nonoperative care.  I discussed in detail the possibility of moving forward with bilateral subdural evacuation via bur hole drainage.  I have discussed the risks and benefits of surgery including but not limited to risks of anesthesia, bleeding, infection, brain injury including stroke, coma, death, recurrence of the subdural, need for further operation, and possible non-benefit.  The patient is  aware of the risks and wishes to proceed.  We will schedule this urgently here today.          ______________________________ Kathaleen Maser. Heba Ige, M.D.     HAP/MEDQ  D:  05/19/2011  T:  05/20/2011  Job:  [id number]  Electronically Signed by Julio Sicks M.D. on 05/24/2011 01:14:59 PM

## 2011-05-25 ENCOUNTER — Inpatient Hospital Stay (HOSPITAL_COMMUNITY): Payer: Medicare HMO

## 2011-05-25 LAB — GLUCOSE, CAPILLARY
Glucose-Capillary: 100 mg/dL — ABNORMAL HIGH (ref 70–99)
Glucose-Capillary: 99 mg/dL (ref 70–99)

## 2011-05-28 ENCOUNTER — Inpatient Hospital Stay (HOSPITAL_COMMUNITY): Payer: Medicare HMO

## 2011-05-28 DIAGNOSIS — I4891 Unspecified atrial fibrillation: Secondary | ICD-10-CM

## 2011-05-28 DIAGNOSIS — S065X9A Traumatic subdural hemorrhage with loss of consciousness of unspecified duration, initial encounter: Secondary | ICD-10-CM

## 2011-05-29 LAB — GLUCOSE, CAPILLARY
Glucose-Capillary: 172 mg/dL — ABNORMAL HIGH (ref 70–99)
Glucose-Capillary: 126 mg/dL — ABNORMAL HIGH (ref 70–99)

## 2011-05-30 ENCOUNTER — Inpatient Hospital Stay (HOSPITAL_COMMUNITY): Payer: Medicare HMO

## 2011-05-30 LAB — GLUCOSE, CAPILLARY: Glucose-Capillary: 118 mg/dL — ABNORMAL HIGH (ref 70–99)

## 2011-05-31 LAB — BASIC METABOLIC PANEL
BUN: 20 mg/dL (ref 6–23)
Chloride: 98 mEq/L (ref 96–112)
Potassium: 3.1 mEq/L — ABNORMAL LOW (ref 3.5–5.1)
Sodium: 138 mEq/L (ref 135–145)

## 2011-05-31 LAB — CBC
HCT: 33.9 % — ABNORMAL LOW (ref 39.0–52.0)
MCV: 86.9 fL (ref 78.0–100.0)
Platelets: 212 10*3/uL (ref 150–400)
RBC: 3.9 MIL/uL — ABNORMAL LOW (ref 4.22–5.81)
WBC: 11.2 10*3/uL — ABNORMAL HIGH (ref 4.0–10.5)

## 2011-05-31 LAB — GLUCOSE, CAPILLARY
Glucose-Capillary: 127 mg/dL — ABNORMAL HIGH (ref 70–99)
Glucose-Capillary: 154 mg/dL — ABNORMAL HIGH (ref 70–99)
Glucose-Capillary: 126 mg/dL — ABNORMAL HIGH (ref 70–99)

## 2011-06-01 ENCOUNTER — Inpatient Hospital Stay (HOSPITAL_COMMUNITY): Payer: Medicare HMO

## 2011-06-01 LAB — GLUCOSE, CAPILLARY: Glucose-Capillary: 105 mg/dL — ABNORMAL HIGH (ref 70–99)

## 2011-06-02 LAB — GLUCOSE, CAPILLARY: Glucose-Capillary: 138 mg/dL — ABNORMAL HIGH (ref 70–99)

## 2011-06-06 ENCOUNTER — Encounter: Payer: Medicare PPO | Admitting: *Deleted

## 2011-08-20 NOTE — Discharge Summary (Signed)
  NAME:  Trevor Brooks, Trevor Brooks NO.:  [id number]  MEDICAL RECORD NO.:  [id number]  LOCATION:  3029                         FACILITY:  [facility]  PHYSICIAN:  Kathaleen Maser. Lulia Schriner, M.D.    DATE OF BIRTH:  [birth year]  DATE OF ADMISSION:  05/19/2011 DATE OF DISCHARGE:  06/04/2011                              DISCHARGE SUMMARY   FINAL DIAGNOSES: 1. Bilateral subacute subdural hematomas. 2. Pharmacologic anticoagulation.  HISTORY OF PRESENT ILLNESS:  Mr. Unrein is an 77 year old male with history of progressive headaches and gait instability.  Workup demonstrates evidence of a large bilateral subacute subdural hematoma with marked mass effect.  The patient presents now for bur hole evacuation emergently.  HOSPITAL COURSE:  The patient's anticoagulation was reversed.  He was taken to the operating room where he underwent bilateral bur hole evacuation of his subdural hematoma.  Bilateral drains were left in place.  The patient awakened from anesthesia with no new problems.  He was gradually mobilized.  Followup scans revealed good improvement in resolution of his subdural hematomas.  The drains were removed, he was mobilized and plan is for discharge home.  CONDITION ON DISCHARGE:  Improved.  DISCHARGE DISPOSITION:  The patient will be discharged home.  He will follow up in my office in 1 week.          ______________________________ Kathaleen Maser. Carter Kassel, M.D.     HAP/MEDQ  D:  07/24/2011  T:  07/24/2011  Job:  [id number]  Electronically Signed by Julio Sicks M.D. on 08/20/2011 07:44:25 AM

## 2011-09-27 LAB — LIPID PANEL
HDL: 27 mg/dL — ABNORMAL LOW (ref >39)
LDL Cholesterol: 101 mg/dL — ABNORMAL HIGH (ref 0–99)
Total CHOL/HDL Ratio: 5.5 RATIO
Triglycerides: 106 mg/dL (ref <150)
VLDL: 21 mg/dL (ref 0–40)

## 2011-09-27 LAB — COMPREHENSIVE METABOLIC PANEL
ALT: 12 U/L (ref 0–53)
Albumin: 3.2 g/dL — ABNORMAL LOW (ref 3.5–5.2)
Alkaline Phosphatase: 44 U/L (ref 39–117)
BUN: 22 mg/dL (ref 6–23)
Calcium: 8.8 mg/dL (ref 8.4–10.5)
Glucose, Bld: 86 mg/dL (ref 70–99)
Potassium: 4 mEq/L (ref 3.5–5.1)
Sodium: 140 mEq/L (ref 135–145)
Total Protein: 5.4 g/dL — ABNORMAL LOW (ref 6.0–8.3)

## 2011-09-27 LAB — BASIC METABOLIC PANEL
BUN: 22 mg/dL (ref 6–23)
Calcium: 9.5 mg/dL (ref 8.4–10.5)
Creatinine, Ser: 1.62 mg/dL — ABNORMAL HIGH (ref 0.4–1.5)
GFR calc non Af Amer: 30 mL/min — ABNORMAL LOW (ref >60)
Glucose, Bld: 104 mg/dL — ABNORMAL HIGH (ref 70–99)
Potassium: 4.4 mEq/L (ref 3.5–5.1)

## 2011-09-27 LAB — CARDIAC PANEL(CRET KIN+CKTOT+MB+TROPI)
CK, MB: 3.4 ng/mL (ref 0.3–4.0)
Relative Index: 2 (ref 0.0–2.5)
Total CK: 174 U/L (ref 7–232)
Total CK: 98 U/L (ref 7–232)

## 2011-09-27 LAB — CK TOTAL AND CKMB (NOT AT ARMC)
CK, MB: 4.5 ng/mL — ABNORMAL HIGH (ref 0.3–4.0)
Relative Index: 3.3 — ABNORMAL HIGH (ref 0.0–2.5)
Total CK: 138 U/L (ref 7–232)

## 2011-09-27 LAB — CBC
HCT: 44.1 % (ref 39.0–52.0)
Platelets: 239 10*3/uL (ref 150–400)
RDW: 13.8 % (ref 11.5–15.5)
WBC: 9.9 10*3/uL (ref 4.0–10.5)

## 2011-09-27 LAB — B-NATRIURETIC PEPTIDE (CONVERTED LAB): Pro B Natriuretic peptide (BNP): 233 pg/mL — ABNORMAL HIGH (ref 0.0–100.0)

## 2011-09-27 LAB — TSH: TSH: 3.468 u[IU]/mL (ref 0.350–4.500)

## 2011-09-27 LAB — TROPONIN I: Troponin I: 0.03 ng/mL (ref 0.00–0.06)

## 2011-10-04 LAB — I-STAT 8, (EC8 V) (CONVERTED LAB)
Acid-Base Excess: 3 — ABNORMAL HIGH
HCT: 46
Operator id: 277751
TCO2: 30
pCO2, Ven: 46.3
pH, Ven: 7.404 — ABNORMAL HIGH

## 2011-10-04 LAB — POCT CARDIAC MARKERS
CKMB, poc: 1.7
CKMB, poc: 2
Myoglobin, poc: 121
Operator id: 277751
Troponin i, poc: <0.05
Troponin i, poc: <0.05

## 2011-10-04 LAB — D-DIMER, QUANTITATIVE: D-Dimer, Quant: 0.25

## 2012-01-08 ENCOUNTER — Ambulatory Visit: Payer: Self-pay | Admitting: Pharmacist

## 2012-01-08 DIAGNOSIS — I4892 Unspecified atrial flutter: Secondary | ICD-10-CM

## 2012-01-08 DIAGNOSIS — I4891 Unspecified atrial fibrillation: Secondary | ICD-10-CM

## 2012-02-06 ENCOUNTER — Encounter (INDEPENDENT_AMBULATORY_CARE_PROVIDER_SITE_OTHER): Payer: Self-pay | Admitting: General Surgery

## 2012-02-12 ENCOUNTER — Encounter (INDEPENDENT_AMBULATORY_CARE_PROVIDER_SITE_OTHER): Payer: Medicare Other | Admitting: General Surgery

## 2012-04-07 ENCOUNTER — Inpatient Hospital Stay (HOSPITAL_COMMUNITY)
Admission: EM | Admit: 2012-04-07 | Discharge: 2012-04-09 | DRG: 309 | Disposition: A | Discharge status: Home / Self Care | Payer: No Typology Code available for payment source | Attending: Internal Medicine | Admitting: Internal Medicine

## 2012-04-07 ENCOUNTER — Emergency Department (HOSPITAL_COMMUNITY): Payer: No Typology Code available for payment source

## 2012-04-07 ENCOUNTER — Encounter (HOSPITAL_COMMUNITY): Payer: Self-pay

## 2012-04-07 ENCOUNTER — Emergency Department (INDEPENDENT_AMBULATORY_CARE_PROVIDER_SITE_OTHER)
Admission: EM | Admit: 2012-04-07 | Discharge: 2012-04-07 | Disposition: A | Discharge status: Nursing Home (Includes ICF) | Payer: Medicare Other | Source: Home / Self Care | Attending: Family Medicine | Admitting: Family Medicine

## 2012-04-07 ENCOUNTER — Encounter (HOSPITAL_COMMUNITY): Payer: Self-pay | Admitting: Emergency Medicine

## 2012-04-07 DIAGNOSIS — I4891 Unspecified atrial fibrillation: Secondary | ICD-10-CM

## 2012-04-07 DIAGNOSIS — R609 Edema, unspecified: Secondary | ICD-10-CM

## 2012-04-07 DIAGNOSIS — F05 Delirium due to known physiological condition: Secondary | ICD-10-CM | POA: Diagnosis not present

## 2012-04-07 DIAGNOSIS — F0390 Unspecified dementia without behavioral disturbance: Secondary | ICD-10-CM | POA: Diagnosis present

## 2012-04-07 DIAGNOSIS — F17200 Nicotine dependence, unspecified, uncomplicated: Secondary | ICD-10-CM | POA: Diagnosis present

## 2012-04-07 DIAGNOSIS — I959 Hypotension, unspecified: Secondary | ICD-10-CM

## 2012-04-07 DIAGNOSIS — G2581 Restless legs syndrome: Secondary | ICD-10-CM | POA: Diagnosis present

## 2012-04-07 DIAGNOSIS — Z79899 Other long term (current) drug therapy: Secondary | ICD-10-CM

## 2012-04-07 DIAGNOSIS — I4892 Unspecified atrial flutter: Secondary | ICD-10-CM

## 2012-04-07 DIAGNOSIS — I495 Sick sinus syndrome: Secondary | ICD-10-CM

## 2012-04-07 DIAGNOSIS — I517 Cardiomegaly: Secondary | ICD-10-CM

## 2012-04-07 DIAGNOSIS — R079 Chest pain, unspecified: Secondary | ICD-10-CM

## 2012-04-07 DIAGNOSIS — R001 Bradycardia, unspecified: Secondary | ICD-10-CM

## 2012-04-07 DIAGNOSIS — I498 Other specified cardiac arrhythmias: Principal | ICD-10-CM | POA: Diagnosis present

## 2012-04-07 DIAGNOSIS — J449 Chronic obstructive pulmonary disease, unspecified: Secondary | ICD-10-CM | POA: Diagnosis present

## 2012-04-07 DIAGNOSIS — S61409A Unspecified open wound of unspecified hand, initial encounter: Secondary | ICD-10-CM | POA: Diagnosis present

## 2012-04-07 DIAGNOSIS — S61412A Laceration without foreign body of left hand, initial encounter: Secondary | ICD-10-CM

## 2012-04-07 DIAGNOSIS — T46905A Adverse effect of unspecified agents primarily affecting the cardiovascular system, initial encounter: Secondary | ICD-10-CM | POA: Diagnosis present

## 2012-04-07 DIAGNOSIS — IMO0002 Reserved for concepts with insufficient information to code with codable children: Secondary | ICD-10-CM | POA: Diagnosis present

## 2012-04-07 DIAGNOSIS — J4489 Other specified chronic obstructive pulmonary disease: Secondary | ICD-10-CM

## 2012-04-07 DIAGNOSIS — T448X5A Adverse effect of centrally-acting and adrenergic-neuron-blocking agents, initial encounter: Secondary | ICD-10-CM | POA: Diagnosis present

## 2012-04-07 LAB — DIFFERENTIAL
Lymphocytes Relative: 19 % (ref 12–46)
Lymphs Abs: 1.5 10*3/uL (ref 0.7–4.0)
Monocytes Relative: 8 % (ref 3–12)
Neutro Abs: 5.8 10*3/uL (ref 1.7–7.7)
Neutrophils Relative %: 71 % (ref 43–77)

## 2012-04-07 LAB — COMPREHENSIVE METABOLIC PANEL
Alkaline Phosphatase: 83 U/L (ref 39–117)
BUN: 29 mg/dL — ABNORMAL HIGH (ref 6–23)
CO2: 29 mEq/L (ref 19–32)
Chloride: 104 mEq/L (ref 96–112)
GFR calc Af Amer: 55 mL/min — ABNORMAL LOW (ref >90)
Glucose, Bld: 105 mg/dL — ABNORMAL HIGH (ref 70–99)
Potassium: 4.7 mEq/L (ref 3.5–5.1)
Total Bilirubin: 0.3 mg/dL (ref 0.3–1.2)

## 2012-04-07 LAB — PROTIME-INR: Prothrombin Time: 14.2 seconds (ref 11.6–15.2)

## 2012-04-07 LAB — CBC
Hemoglobin: 13.6 g/dL (ref 13.0–17.0)
MCH: 29.8 pg (ref 26.0–34.0)
RBC: 4.56 MIL/uL (ref 4.22–5.81)

## 2012-04-07 MED ORDER — SODIUM CHLORIDE 0.9 % IV BOLUS (SEPSIS)
1000.0000 mL | Freq: Once | INTRAVENOUS | Status: AC
  Administered 2012-04-07: 1000 mL via INTRAVENOUS

## 2012-04-07 MED ORDER — ATROPINE SULFATE 1 MG/ML IJ SOLN
1.0000 mg | Freq: Once | INTRAMUSCULAR | Status: AC
  Administered 2012-04-07: 1 mg via INTRAVENOUS

## 2012-04-07 MED ORDER — LORAZEPAM 1 MG PO TABS
1.0000 mg | ORAL_TABLET | Freq: Once | ORAL | Status: AC
  Administered 2012-04-07: 1 mg via ORAL

## 2012-04-07 MED ORDER — SODIUM CHLORIDE 0.9 % IV SOLN
Freq: Once | INTRAVENOUS | Status: AC
  Administered 2012-04-07: 21:00:00 via INTRAVENOUS

## 2012-04-07 MED ORDER — SODIUM CHLORIDE 0.9 % IV SOLN
Freq: Once | INTRAVENOUS | Status: AC
  Administered 2012-04-07: 18:00:00 via INTRAVENOUS

## 2012-04-07 MED ORDER — LORAZEPAM 1 MG PO TABS
ORAL_TABLET | ORAL | Status: AC
  Filled 2012-04-07: qty 1

## 2012-04-07 MED ORDER — ATROPINE SULFATE 0.1 MG/ML IJ SOLN
INTRAMUSCULAR | Status: AC
  Filled 2012-04-07: qty 10
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
## 2012-04-07 NOTE — ED Notes (Signed)
Pt becoming anxious and agitated. Per family, this occurs when pt comes to the ED. EDP made aware. Will continue to monitor.
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
## 2012-04-07 NOTE — ED Notes (Signed)
Family made aware of the wait for Stepdown bed. Will continue to monitor.

## 2012-04-08 ENCOUNTER — Encounter (HOSPITAL_COMMUNITY): Payer: Self-pay | Admitting: *Deleted

## 2012-04-08 DIAGNOSIS — I4891 Unspecified atrial fibrillation: Secondary | ICD-10-CM

## 2012-04-08 DIAGNOSIS — I498 Other specified cardiac arrhythmias: Principal | ICD-10-CM

## 2012-04-08 LAB — BASIC METABOLIC PANEL
BUN: 28 mg/dL — ABNORMAL HIGH (ref 6–23)
CO2: 26 mEq/L (ref 19–32)
Calcium: 9 mg/dL (ref 8.4–10.5)
Chloride: 104 mEq/L (ref 96–112)
Creatinine, Ser: 1.3 mg/dL (ref 0.50–1.35)

## 2012-04-08 LAB — CBC
HCT: 39.9 % (ref 39.0–52.0)
Hemoglobin: 13.1 g/dL (ref 13.0–17.0)
MCH: 29.8 pg (ref 26.0–34.0)
MCHC: 32.8 g/dL (ref 30.0–36.0)
RDW: 14.3 % (ref 11.5–15.5)

## 2012-04-08 LAB — MRSA PCR SCREENING: MRSA by PCR: NEGATIVE

## 2012-04-08 MED ORDER — ASPIRIN EC 81 MG PO TBEC
81.0000 mg | DELAYED_RELEASE_TABLET | Freq: Every day | ORAL | Status: DC
  Administered 2012-04-08 – 2012-04-09 (×2): 81 mg via ORAL
  Filled 2012-04-08 (×2): qty 1

## 2012-04-08 MED ORDER — DEXTROSE-NACL 5-0.9 % IV SOLN
INTRAVENOUS | Status: DC
  Administered 2012-04-08: 01:00:00 via INTRAVENOUS

## 2012-04-08 MED ORDER — ONDANSETRON HCL 4 MG/2ML IJ SOLN: 4.0000 mg | Freq: Four times a day (QID) | INTRAMUSCULAR | Status: DC | PRN

## 2012-04-08 MED ORDER — SODIUM CHLORIDE 0.9 % IJ SOLN
3.0000 mL | Freq: Two times a day (BID) | INTRAMUSCULAR | Status: DC
  Administered 2012-04-08 (×2): 3 mL via INTRAVENOUS

## 2012-04-08 MED ORDER — ONDANSETRON HCL 4 MG PO TABS: 4.0000 mg | ORAL_TABLET | Freq: Four times a day (QID) | ORAL | Status: DC | PRN

## 2012-04-08 MED ORDER — LORAZEPAM 0.5 MG PO TABS: 1.0000 mg | ORAL_TABLET | Freq: Three times a day (TID) | ORAL | Status: DC | PRN

## 2012-04-08 MED ORDER — ENOXAPARIN SODIUM 40 MG/0.4ML ~~LOC~~ SOLN
40.0000 mg | SUBCUTANEOUS | Status: DC
  Administered 2012-04-08 (×2): 40 mg via SUBCUTANEOUS
  Filled 2012-04-08 (×2): qty 0.4
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
## 2012-04-08 NOTE — Progress Notes (Signed)
Utilization Review Completed.Trevor Brooks T4/16/2013   

## 2012-04-08 NOTE — ED Notes (Signed)
EKG completed and shown to Dr. Conley Rolls
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
## 2012-04-09 MED ORDER — METOPROLOL TARTRATE 25 MG PO TABS: 25.0000 mg | ORAL_TABLET | Freq: Four times a day (QID) | ORAL | Status: DC | PRN
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
## 2012-04-09 NOTE — Progress Notes (Signed)
Pt got very agitated and combative about 5 am. Family was called and updated about pt's status. Family agreed to come as soon as possible to help with the patient. Engineer, materials were called due to pt's combative behavior. Pt was able to calm down and return back to bed. Family and sitter are at the bedside.
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
## 2012-04-09 NOTE — Discharge Instructions (Signed)
Laceration Care, Adult A laceration is a cut that goes through all layers of the skin. The cut goes into the tissue beneath the skin. HOME CARE For stitches (sutures) or staples:  Keep the cut clean and dry.   If you have a bandage (dressing), change it at least once a day. Change the bandage if it gets wet or dirty, or as told by your doctor.   Wash the cut with soap and water 2 times a day. Rinse the cut with water. Pat it dry with a clean towel.   Put a thin layer of medicated cream on the cut as told by your doctor.   You may shower after the first 24 hours. Do not soak the cut in water until the stitches are removed.   Only take medicines as told by your doctor.   Have your stitches or staples removed as told by your doctor.  For skin adhesive strips:  Keep the cut clean and dry.   Do not get the strips wet. You may take a bath, but be careful to keep the cut dry.   If the cut gets wet, pat it dry with a clean towel.   The strips will fall off on their own. Do not remove the strips that are still stuck to the cut.  For wound glue:  You may shower or take baths. Do not soak or scrub the cut. Do not swim. Avoid heavy sweating until the glue falls off on its own. After a shower or bath, pat the cut dry with a clean towel.   Do not put medicine on your cut until the glue falls off.   If you have a bandage, do not put tape over the glue.   Avoid lots of sunlight or tanning lamps until the glue falls off. Put sunscreen on the cut for the first year to reduce your scar.   The glue will fall off on its own. Do not pick at the glue.  You may need a tetanus shot if:  You cannot remember when you had your last tetanus shot.   You have never had a tetanus shot.  If you need a tetanus shot and you choose not to have one, you may get tetanus. Sickness from tetanus can be serious. GET HELP RIGHT AWAY IF:   Your pain does not get better with medicine.   Your arm, hand, leg, or  foot loses feeling (numbness) or changes color.   Your cut is bleeding.   Your joint feels weak, or you cannot use your joint.   You have painful lumps on your body.   Your cut is red, puffy (swollen), or painful.   You have a red line on the skin near the cut.   You have yellowish-white fluid (pus) coming from the cut.   You have a fever.   You have a bad smell coming from the cut or bandage.   Your cut breaks open before or after stitches are removed.   You notice something coming out of the cut, such as wood or glass.   You cannot move a finger or toe.  MAKE SURE YOU:   Understand these instructions.   Will watch your condition.   Will get help right away if you are not doing well or get worse.  Document Released: 05/28/2008 Document Revised: 11/29/2011 Document Reviewed: 06/05/2011 [facility] Patient Information 2012 [place], [place].Bradycardia You have a slow heart rate. This is called bradycardia. At rest, the normal  heart rate is between 60-100 beats per minute. A slow heart may cause weakness, dizziness, loss of consciousness, and shortness of breath. This gets worse when you are active.  The medical causes of bradycardia can include: Heart and thyroid problems.  High potassium.  Side effects of some medicines.  Well-trained athletes may have heart rates as slow as 42 beats per minute. Evaluation of bradycardia may require an electrocardiogram (ECG), blood tests, and possibly other heart studies.  Bradycardia due to heart disease can be a serious problem. Damage to the heart's electrical system may require a temporary or permanent pacemaker. Beta-blocker drugs, digoxin, and other medicines used to control blood pressure and heart rhythms will also slow the heart. These medicines may need to be used in lower doses, or be stopped, if you have problems from the bradycardia.  SEEK IMMEDIATE MEDICAL CARE IF:  You develop fainting, extreme weakness, shortness of breath, or  fever.  You develop severe chest or abdominal pain, repeated vomiting, or dehydration.  You become sweaty and weak.  MAKE SURE YOU:  Understand these instructions.  Will watch your condition.  Will get help right away if you are not doing well or get worse.  Document Released: 12/10/2005 Document Revised: 11/29/2011 Document Reviewed: 03/09/2009 [facility] Patient Information 2012 [place], [place].Tachycardia, Nonspecific In adults, the heart normally beats between 60 and 100 times a minute. A heart rate over 100 is called tachycardia. When your heart beats too fast, it may not be able to pump enough blood to the rest of the body. CAUSES   Exercise or exertion.   Fever.   Pain or injury.   Infection.   Loss of fluid (dehydration).   Overactive thyroid.   Lack of red blood cells (anemia).   Anxiety.   Alcohol.   Heart arrhythmia.   Caffeine.   Tobacco products.   Diet pills.   Street drugs.   Heart disease.  SYMPTOMS  Palpitations (rapid or irregular heartbeat).   Dizziness.   Tiredness (fatigue).   Shortness of breath.  DIAGNOSIS  After an exam and taking a history, your caregiver may order:  Blood tests.   Electrocardiogram (EKG).   Heart monitor.  TREATMENT  Treatment will depend on the cause and potential for harm. It may include:  Intravenous (IV) replacement of fluids or blood.   Antidote or reversal medicines.   Changes in your present medicines.   Lifestyle changes.  HOME CARE INSTRUCTIONS   Get rest.   Drink enough water and fluids to keep your urine clear or pale yellow.   Avoid:   Caffeine.   Nicotine.   Alcohol.   Stress.   Chocolate.   Stimulants.   Only take medicine as directed by your caregiver.  SEEK IMMEDIATE MEDICAL CARE IF:   You have pain in your chest, upper arms, jaw, or neck.   You become weak, dizzy, or feel faint.   You have palpitations that will not go away.   You throw up (vomit), have diarrhea,  or pass blood.   You look pale and your skin is cool and wet.  MAKE SURE YOU:   Understand these instructions.   Will watch your condition.   Will get help right away if you are not doing well or get worse.  Document Released: 01/17/2005 Document Revised: 11/29/2011 Document Reviewed: 11/20/2011 [employer] Patient Information 2012 [place], [place].

## 2012-04-10 ENCOUNTER — Encounter: Payer: Self-pay | Admitting: Cardiology

## 2012-05-02 ENCOUNTER — Ambulatory Visit (INDEPENDENT_AMBULATORY_CARE_PROVIDER_SITE_OTHER): Payer: Medicare Other | Admitting: Nurse Practitioner

## 2012-05-02 ENCOUNTER — Encounter: Payer: Self-pay | Admitting: Nurse Practitioner

## 2012-05-02 VITALS — BP 110/80 | HR 81 | Ht 70.0 in | Wt 131.0 lb

## 2012-05-02 DIAGNOSIS — R55 Syncope and collapse: Secondary | ICD-10-CM

## 2012-05-02 DIAGNOSIS — R001 Bradycardia, unspecified: Secondary | ICD-10-CM

## 2012-05-02 DIAGNOSIS — I4891 Unspecified atrial fibrillation: Secondary | ICD-10-CM

## 2012-05-02 DIAGNOSIS — I498 Other specified cardiac arrhythmias: Secondary | ICD-10-CM
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
## 2012-05-02 NOTE — Assessment & Plan Note (Signed)
He is currently in sinus rhythm today.
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
## 2012-05-02 NOTE — Patient Instructions (Addendum)
You should not be driving.  Stay on your current medicines.  We will see you back in about 4 months.  Call the [facility] office at [phone] if you have any questions, problems or concerns.

## 2012-06-22 ENCOUNTER — Inpatient Hospital Stay (HOSPITAL_COMMUNITY)
Admission: EM | Admit: 2012-06-22 | Discharge: 2012-06-24 | DRG: 309 | Disposition: A | Discharge status: Home / Self Care | Payer: Medicare Other | Attending: Internal Medicine | Admitting: Internal Medicine

## 2012-06-22 ENCOUNTER — Emergency Department (HOSPITAL_COMMUNITY): Payer: Medicare Other

## 2012-06-22 ENCOUNTER — Inpatient Hospital Stay (HOSPITAL_COMMUNITY): Payer: Medicare Other

## 2012-06-22 ENCOUNTER — Encounter (HOSPITAL_COMMUNITY): Payer: Self-pay

## 2012-06-22 DIAGNOSIS — Z8673 Personal history of transient ischemic attack (TIA), and cerebral infarction without residual deficits: Secondary | ICD-10-CM

## 2012-06-22 DIAGNOSIS — R079 Chest pain, unspecified: Secondary | ICD-10-CM

## 2012-06-22 DIAGNOSIS — J449 Chronic obstructive pulmonary disease, unspecified: Secondary | ICD-10-CM | POA: Diagnosis present

## 2012-06-22 DIAGNOSIS — J4489 Other specified chronic obstructive pulmonary disease: Secondary | ICD-10-CM | POA: Diagnosis present

## 2012-06-22 DIAGNOSIS — Z8249 Family history of ischemic heart disease and other diseases of the circulatory system: Secondary | ICD-10-CM

## 2012-06-22 DIAGNOSIS — I495 Sick sinus syndrome: Principal | ICD-10-CM | POA: Diagnosis present

## 2012-06-22 DIAGNOSIS — I959 Hypotension, unspecified: Secondary | ICD-10-CM

## 2012-06-22 DIAGNOSIS — R609 Edema, unspecified: Secondary | ICD-10-CM

## 2012-06-22 DIAGNOSIS — I059 Rheumatic mitral valve disease, unspecified: Secondary | ICD-10-CM | POA: Diagnosis present

## 2012-06-22 DIAGNOSIS — Z823 Family history of stroke: Secondary | ICD-10-CM

## 2012-06-22 DIAGNOSIS — E785 Hyperlipidemia, unspecified: Secondary | ICD-10-CM | POA: Diagnosis present

## 2012-06-22 DIAGNOSIS — F17200 Nicotine dependence, unspecified, uncomplicated: Secondary | ICD-10-CM | POA: Diagnosis present

## 2012-06-22 DIAGNOSIS — G2581 Restless legs syndrome: Secondary | ICD-10-CM | POA: Diagnosis present

## 2012-06-22 DIAGNOSIS — R55 Syncope and collapse: Secondary | ICD-10-CM

## 2012-06-22 DIAGNOSIS — IMO0002 Reserved for concepts with insufficient information to code with codable children: Secondary | ICD-10-CM

## 2012-06-22 DIAGNOSIS — Z880 Allergy status to penicillin: Secondary | ICD-10-CM

## 2012-06-22 DIAGNOSIS — I4892 Unspecified atrial flutter: Secondary | ICD-10-CM | POA: Diagnosis present

## 2012-06-22 DIAGNOSIS — I517 Cardiomegaly: Secondary | ICD-10-CM

## 2012-06-22 LAB — COMPREHENSIVE METABOLIC PANEL
ALT: 7 U/L (ref 0–53)
AST: 20 U/L (ref 0–37)
Albumin: 3.4 g/dL — ABNORMAL LOW (ref 3.5–5.2)
Alkaline Phosphatase: 64 U/L (ref 39–117)
Potassium: 4.2 mEq/L (ref 3.5–5.1)
Sodium: 137 mEq/L (ref 135–145)
Total Protein: 6.1 g/dL (ref 6.0–8.3)

## 2012-06-22 LAB — URINALYSIS, ROUTINE W REFLEX MICROSCOPIC
Hgb urine dipstick: NEGATIVE
Leukocytes, UA: NEGATIVE
Nitrite: NEGATIVE
Specific Gravity, Urine: 1.013 (ref 1.005–1.030)
Urobilinogen, UA: 0.2 mg/dL (ref 0.0–1.0)

## 2012-06-22 LAB — CBC WITH DIFFERENTIAL/PLATELET
Basophils Absolute: 0 10*3/uL (ref 0.0–0.1)
Basophils Relative: 0 % (ref 0–1)
Hemoglobin: 13.8 g/dL (ref 13.0–17.0)
MCHC: 33.3 g/dL (ref 30.0–36.0)
Monocytes Relative: 5 % (ref 3–12)
Neutro Abs: 5.5 10*3/uL (ref 1.7–7.7)
Neutrophils Relative %: 76 % (ref 43–77)

## 2012-06-22 LAB — CARDIAC PANEL(CRET KIN+CKTOT+MB+TROPI): Total CK: 86 U/L (ref 7–232)

## 2012-06-22 LAB — POCT I-STAT TROPONIN I: Troponin i, poc: 0.03 ng/mL (ref 0.00–0.08)

## 2012-06-22 MED ORDER — ACETAMINOPHEN 325 MG PO TABS: 650.0000 mg | ORAL_TABLET | Freq: Four times a day (QID) | ORAL | Status: DC | PRN

## 2012-06-22 MED ORDER — ACETAMINOPHEN 650 MG RE SUPP: 650.0000 mg | Freq: Four times a day (QID) | RECTAL | Status: DC | PRN

## 2012-06-22 MED ORDER — SODIUM CHLORIDE 0.9 % IV BOLUS (SEPSIS)
500.0000 mL | Freq: Once | INTRAVENOUS | Status: AC
  Administered 2012-06-22: 500 mL via INTRAVENOUS

## 2012-06-22 MED ORDER — SODIUM CHLORIDE 0.9 % IV SOLN
INTRAVENOUS | Status: DC
  Administered 2012-06-23: 04:00:00 via INTRAVENOUS

## 2012-06-22 NOTE — Progress Notes (Signed)
Was told patient had a pause of 1.69 but was misread and shows PACs noted on printed strip. Will notify oncoming nurse and continue to monitor patient.
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
## 2012-06-22 NOTE — ED Notes (Signed)
Pt reported to EMS in church, thought pt sleeping and he didn't wake, took sternal rub to wake him, happened twice, to hospital, oriented times 4, no dizziness, pain in the left shoulder and neck, chronic,
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
## 2012-06-22 NOTE — ED Notes (Signed)
Placed call for reg diet tray

## 2012-06-22 NOTE — Progress Notes (Signed)
Nurse notified of patients HR dropping to 37 non sustained on the monitor. Patient assessed. Asymptomatic. BP 116/67, 92% on RA. MD on call notified. Asked to put strips in chart and leave note. Will continue to monitor. Mccade Sullenberger, Melida Quitter

## 2012-06-22 NOTE — Progress Notes (Signed)
Patient transferred via stretcher from the ED. Patient ambulated from stretcher to bed in assigned room 4731 with assistance. VSS and complains of no pain. Bed alarm actively on. Will continue to monitor.

## 2012-06-23 ENCOUNTER — Encounter (HOSPITAL_COMMUNITY): Payer: Self-pay | Admitting: *Deleted

## 2012-06-23 DIAGNOSIS — R55 Syncope and collapse: Secondary | ICD-10-CM

## 2012-06-23 DIAGNOSIS — I059 Rheumatic mitral valve disease, unspecified: Secondary | ICD-10-CM

## 2012-06-23 DIAGNOSIS — I498 Other specified cardiac arrhythmias: Secondary | ICD-10-CM

## 2012-06-23 LAB — BASIC METABOLIC PANEL
CO2: 28 mEq/L (ref 19–32)
Calcium: 9 mg/dL (ref 8.4–10.5)
GFR calc non Af Amer: 54 mL/min — ABNORMAL LOW (ref >90)
Potassium: 4.3 mEq/L (ref 3.5–5.1)
Sodium: 138 mEq/L (ref 135–145)

## 2012-06-23 LAB — SEDIMENTATION RATE: Sed Rate: 7 mm/hr (ref 0–16)

## 2012-06-23 LAB — CARDIAC PANEL(CRET KIN+CKTOT+MB+TROPI)
CK, MB: 3.9 ng/mL (ref 0.3–4.0)
CK, MB: 3.8 ng/mL (ref 0.3–4.0)
Relative Index: INVALID (ref 0.0–2.5)
Relative Index: 3.7 — ABNORMAL HIGH (ref 0.0–2.5)
Total CK: 94 U/L (ref 7–232)
Total CK: 102 U/L (ref 7–232)

## 2012-06-23 LAB — CBC
Hemoglobin: 13.5 g/dL (ref 13.0–17.0)
MCHC: 33.2 g/dL (ref 30.0–36.0)
WBC: 7.3 10*3/uL (ref 4.0–10.5)

## 2012-06-23 MED ORDER — NICOTINE 14 MG/24HR TD PT24
14.0000 mg | MEDICATED_PATCH | Freq: Every day | TRANSDERMAL | Status: DC
  Administered 2012-06-23: 14 mg via TRANSDERMAL
  Filled 2012-06-23 (×2): qty 1

## 2012-06-23 NOTE — Progress Notes (Signed)
Bilateral carotid artery duplex completed.  Preliminary report is no evidence of significant ICA stenosis. 

## 2012-06-23 NOTE — Progress Notes (Signed)
Utilization review completed.  

## 2012-06-23 NOTE — Progress Notes (Signed)
*  PRELIMINARY RESULTS* Echocardiogram 2D Echocardiogram has been performed.  Trevor Brooks 06/23/2012, 11:10 AM

## 2012-06-23 NOTE — Progress Notes (Signed)
Subjective: The patient was seen earlier today, and was feeling fine and wanting to go home. He had not had recurrent lightheadedness and downplayed the syncope episode, feeling that he had just nodded off to sleep. He denied chest pain or dyspnea today and ate breakfast without problems, had no further neck pain,  Objective: Vital signs in last 24 hours: Temp:  [97.4 F (36.3 C)-98.3 F (36.8 C)] 97.4 F (36.3 C) (07/01 1858) Pulse Rate:  [64-86] 79  (07/01 1858) Resp:  [18-22] 22  (07/01 1858) BP: (99-144)/(53-88) 129/72 mmHg (07/01 1858) SpO2:  [94 %-100 %] 96 % (07/01 1858) Weight:  [57.6 kg (126 lb 15.8 oz)] 57.6 kg (126 lb 15.8 oz) (07/01 0523) Weight change:    Intake/Output from previous day: 06/30 0701 - 07/01 0700 In: -  Out: 175 [Urine:175]   General appearance: alert, cooperative and no distress Neck: no adenopathy, no carotid bruit, no JVD, supple, symmetrical, trachea midline and thyroid not enlarged, symmetric, no tenderness/mass/nodules Resp: clear to auscultation bilaterally Cardio: regular rate and rhythm, S1, S2 normal, no murmur, click, rub or gallop Extremities: extremities normal, atraumatic, no cyanosis or edema  Lab Results:  Basename 06/23/12 0236 06/22/12 1245  WBC 7.3 7.2  HGB 13.5 13.8  HCT 40.7 41.4  PLT 164 168   BMET  Basename 06/23/12 0236 06/22/12 1245  NA 138 137  K 4.3 4.2  CL 101 100  CO2 28 27  GLUCOSE 95 91  BUN 19 18  CREATININE 1.17 1.18  CALCIUM 9.0 9.5   CMET CMP     Component Value Date/Time   NA 138 06/23/2012 0236   K 4.3 06/23/2012 0236   CL 101 06/23/2012 0236   CO2 28 06/23/2012 0236   GLUCOSE 95 06/23/2012 0236   BUN 19 06/23/2012 0236   CREATININE 1.17 06/23/2012 0236   CALCIUM 9.0 06/23/2012 0236   PROT 6.1 06/22/2012 1245   ALBUMIN 3.4* 06/22/2012 1245   AST 20 06/22/2012 1245   ALT 7 06/22/2012 1245   ALKPHOS 64 06/22/2012 1245   BILITOT 0.3 06/22/2012 1245   GFRNONAA 54* 06/23/2012 0236   GFRAA 62* 06/23/2012 0236     CBG (last 3)  No results found for this basename: GLUCAP:3 in the last 72 hours  INR RESULTS:   Lab Results  Component Value Date   INR 1.08 04/07/2012   INR 1.22 05/21/2011   INR 1.20 05/21/2011     Studies/Results: Dg Chest 2 View  06/22/2012  *RADIOLOGY REPORT*  Clinical Data: Syncope  CHEST - 2 VIEW  Comparison: 04/07/2012  Findings: Atheromatous aorta.  Pectus excavatum deformity of the thorax.  Vascular clips near the GE junction.  Heart size normal. Hyperinflated with some flattening of the diaphragmatic leaflets. No focal infiltrate or overt edema.  IMPRESSION:  1.  Hyperinflation without acute or superimposed abnormality.  Original Report Authenticated By: Osa Craver, M.D.   Ct Head Wo Contrast  06/22/2012  *RADIOLOGY REPORT*  Clinical Data: Severe headache, near-syncope.  CT HEAD WITHOUT CONTRAST  Technique:  Contiguous axial images were obtained from the base of the skull through the vertex without contrast.  Comparison: 04/07/2012  Findings: Bilateral frontal and parietal burr holes are again noted.  Small bilateral low attenuation extra-axial fluid collections are again noted, not significantly changed anteriorly over the frontal lobes.  This may have decreased slightly laterally.  No evidence of acute hemorrhage. There is atrophy and chronic small vessel disease changes.  No acute infarction.  No hydrocephalus.  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  Atrophy, chronic small vessel disease.  Original Report Authenticated By: Cyndie Chime, M.D.    Medications: I have reviewed the patient's current medications.  Assessment/Plan: #1 Syncope:  Stable, however he has had documented severe bradycardia off of meds. Agree with consideration of pacemaker placement. If pacemaker not to be placed at this time then will discharge to home tomorrow after further telemetry and completion of  workup for syncope.  LOS: 1 day   Simran Mannis G 06/23/2012, 8:20 PM

## 2012-06-23 NOTE — Evaluation (Signed)
Physical Therapy Evaluation Patient Details Name: Trevor Brooks MRN: [id number] DOB: 27-Oct-1922 Today's Date: 06/23/2012 Time: 0865-7846 PT Time Calculation (min): 23 min  PT Assessment / Plan / Recommendation Clinical Impression  Pt is an 78 y/o male admitted for syncopal episode at church.  Pt present with mild -moderate instability with amublation and will benefit from continued PT to address gait and prescribe most appropriate assistive device.     PT Assessment  Patient needs continued PT services    Follow Up Recommendations  Supervision - Intermittent    Barriers to Discharge        Equipment Recommendations  None recommended by PT    Recommendations for Other Services     Frequency Min 3X/week    Precautions / Restrictions     Pertinent Vitals/Pain No c/o pain.        Mobility  Bed Mobility Bed Mobility: Supine to Sit;Sitting - Scoot to Edge of Bed Supine to Sit: 7: Independent;HOB flat Sitting - Scoot to Edge of Bed: 7: Independent Transfers Transfers: Sit to Stand;Stand to Sit Sit to Stand: 7: Independent;Without upper extremity assist;From bed Stand to Sit: 5: Supervision;With upper extremity assist;To bed Details for Transfer Assistance: cues for safe technique when sitting.  Ambulation/Gait Ambulation/Gait Assistance: 4: Min guard Ambulation Distance (Feet): 150 Feet Assistive device: None Ambulation/Gait Assistance Details: pt had 3 LOB episodes while ambulating. Pt had difficulty avoiding obstacles in the hall way Gait Pattern: Within Functional Limits Stairs: No Wheelchair Mobility Wheelchair Mobility: No    Exercises     PT Diagnosis: Difficulty walking  PT Problem List: Decreased balance;Decreased mobility PT Treatment Interventions: Gait training;DME instruction;Stair training;Functional mobility training;Therapeutic activities;Therapeutic exercise;Balance training;Patient/family education;Neuromuscular re-education   PT Goals Acute Rehab  PT Goals PT Goal Formulation: With patient Time For Goal Achievement: 06/30/12 Potential to Achieve Goals: Good Pt will Ambulate: >150 feet;Independently;with least restrictive assistive device PT Goal: Ambulate - Progress: Goal set today Pt will Go Up / Down Stairs: 3-5 stairs;Independently;with least restrictive assistive device PT Goal: Up/Down Stairs - Progress: Goal set today  Visit Information  Last PT Received On: 06/23/12    Subjective Data      Prior Functioning  Home Living Lives With: Spouse Available Help at Discharge: Family Type of Home: House Home Access: Stairs to enter [employer] of Steps: 2 Entrance Stairs-Rails: None Home Layout: One level Bathroom Shower/Tub: Tub/shower unit;Door [employer] Toilet: Pharmacist, community: Yes How Accessible: Accessible via walker Home Adaptive Equipment: Shower chair with back Prior Function Level of Independence: Independent Able to Take Stairs?: Yes Driving: Yes Vocation: Retired Musician: [employer] Dominant Hand: Right    Cognition  Overall Cognitive Status: Appears within functional limits for tasks assessed/performed Arousal/Alertness: Awake/alert Orientation Level: Oriented X4 / Intact Behavior During Session: WFL for tasks performed    Extremity/Trunk Assessment Right Upper Extremity Assessment RUE ROM/Strength/Tone: Within functional levels Left Upper Extremity Assessment LUE ROM/Strength/Tone: Within functional levels Right Lower Extremity Assessment RLE ROM/Strength/Tone: Within functional levels Left Lower Extremity Assessment LLE ROM/Strength/Tone: Within functional levels Trunk Assessment Trunk Assessment: Normal   Balance Balance Balance Assessed: Yes High Level Balance High Level Balance Activites: Direction changes;Turns;Sudden stops;Head turns High Level Balance Comments: lob with head turns and chang in direction  End of Session PT - End of  Session Equipment Utilized During Treatment: Gait belt Activity Tolerance: Patient tolerated treatment well Patient left: in chair;with call bell/phone within reach;with family/visitor present Nurse Communication: Mobility status  GP     Alferd Apa  06/23/2012, 5:35 PM Shianne Zeiser L. Lenzi Marmo DPT [phone]
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
## 2012-06-23 NOTE — Progress Notes (Signed)
Nurse notified that patients HR is bradying down to low 30's and having frequent pauses on the monitor. The longest pause being 2.61 seconds. The patient was assessed, asymptomatic. BP 108/60. The on-call MD was notified. He called me back about 10 minutes later and said he spoke with cardiology. He would like [place] to continue to monitor unless the patient has a pause of >3 seconds and then move to step-down. Will do so. Janelis Stelzer, Melida Quitter
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
## 2012-06-24 ENCOUNTER — Telehealth: Payer: Self-pay | Admitting: Cardiology

## 2012-06-24 ENCOUNTER — Encounter: Payer: Self-pay | Admitting: *Deleted

## 2012-06-24 DIAGNOSIS — I4891 Unspecified atrial fibrillation: Secondary | ICD-10-CM

## 2012-06-24 NOTE — Discharge Summary (Signed)
Physician Discharge Summary  Patient ID: Trevor Brooks MRN: [id number] DOB/AGE: 1922/03/23 78 y.o.  Admit date: 06/22/2012 Discharge date: 06/24/2012   Discharge Diagnoses:  Active Problems:  Sinoatrial node dysfunction  atrial fib/flutter  rapid rates  Syncope Chronic Tobacco use, with probable COPD Mitral Regurgitation, mild Hyperlipidemia May 2012 Bilateral Subdural Hematomas Restless Legs Syndrome  Discharged Condition: good  Hospital Course:  Patient is a 78 year old man who was in his usual state of fairly good health until approximately 11 AM while in church. He had a sudden onset of left neck and posterior neck pain of moderate intensity while standing up. He felt lightheaded and sat down then had an episode of syncope with loss of consciousness for about 5 minutes. 911 was called and he was transported to the emergency room for evaluation. He did not have episodes of substernal chest pain or difficulty breathing or diaphoresis or nausea. While on route to the hospital he had 2 further episodes of brief loss of consciousness. His past medical history is significant for 2004 episode of syncope felt to be vasovagal, paroxysmal atrial fibrillation in 2008 with Coumadin anticoagulation stopped after May 2012 bilateral subdural hematomas without trauma, hyperlipidemia, 2010 episode of atrial flutter treated with cardioversion, and no history of coronary artery disease with cardiac catheterization not showing obstructive coronary disease in 1996.  He was admitted to a telemetry bed for further evaluation. Telemetry did show some asymptomatic sinus pauses of up to 3 seconds, but no tachyarrhythmias. He had serial cardiac enzymes drawn which showed no evidence of myocardial infarction. He was followed closely by a cardiologist and electrophysiologist. A transthoracic echocardiogram was done which showed left ventricular ejection fraction of 65% with mild mitral regurgitation and normal right  ventricular size and function. 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 There were no complications during his hospitalization. Dr. Sherryl Manges evaluated him and felt that it was not certain that he had sufficient tachycardia to justify pacing to allow use of rate controlling drugs for his tachybradycardia syndrome. His plan was to do an event monitor study as an outpatient for further evaluation. He will continue use of low-dose metoprolol if he has an episode of heart rate greater than 130.  Consults: cardiology  Significant Diagnostic Studies:  Dg Chest 2 View  06/22/2012  *RADIOLOGY REPORT*  Clinical Data: Syncope  CHEST - 2 VIEW  Comparison: 04/07/2012  Findings: Atheromatous aorta.  Pectus excavatum deformity of the thorax.  Vascular clips near the GE junction.  Heart size normal. Hyperinflated with some flattening of the diaphragmatic leaflets. No focal infiltrate or overt edema.  IMPRESSION:  1.  Hyperinflation without acute or superimposed abnormality.  Original Report Authenticated By: Osa Craver, M.D.   Ct Head Wo Contrast  06/22/2012  *RADIOLOGY REPORT*  Clinical Data: Severe headache, near-syncope.  CT HEAD WITHOUT CONTRAST  Technique:  Contiguous axial images were obtained from the base of the skull through the vertex without contrast.  Comparison: 04/07/2012  Findings: Bilateral frontal and parietal burr holes are again noted.  Small bilateral low attenuation extra-axial fluid collections are again noted, not significantly changed anteriorly over the frontal lobes.  This may have decreased slightly laterally.  No evidence of acute hemorrhage. There is atrophy and chronic small vessel disease changes.  No acute infarction.  No hydrocephalus.  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  Atrophy, chronic small vessel disease.  Original Report Authenticated By: Cyndie Chime, M.D.    Labs: Lab Results  Component Value Date   WBC 7.3 06/23/2012   HGB 13.5 06/23/2012   HCT 40.7 06/23/2012   MCV 88.9 06/23/2012   PLT 164 06/23/2012     Lab 06/23/12 0236 06/22/12 1245  NA 138 --  K 4.3 --  CL 101 --  CO2 28 --  BUN 19 --  CREATININE 1.17 --  CALCIUM 9.0 --  PROT -- 6.1  BILITOT -- 0.3  ALKPHOS -- 64  ALT -- 7  AST -- 20  GLUCOSE 95 --       Lab Results  Component Value Date   INR 1.08 04/07/2012   INR 1.22 05/21/2011   INR 1.20 05/21/2011     No results found for this or any previous visit (from the past 240 hour(s)).    Discharge Exam: Blood pressure 147/78, pulse 70, temperature 97.6 F (36.4 C), temperature source Oral, resp. rate 20, height 5\' 10"  (1.778 m), weight 55.8 kg (123 lb 0.3 oz), SpO2 96.00%.  Physical Exam: In general, he is a thin elderly white man who was in no apparent distress while sitting upright in his room awaiting discharge. HEENT exam was within normal limits, neck was supple without jugular venous distention or carotid bruit, chest was clear to auscultation with enlarged lungs, heart had a regular rate and rhythm with a systolic ejection murmur grade 2/6 at the left sternal border, abdomen had normal bowel sounds no hepatosplenomegaly or tenderness, extremities were without cyanosis, clubbing, or edema. Neurologic exam was nonfocal. He was able to walk without difficulty independently.  Disposition: He'll be discharged to home with plans to set up an event monitor study and expeditious cardiologist followup.  Discharge Orders    Future Appointments: Provider: Department: Dept Phone: Center:   09/02/2012 2:15 PM Laurey Morale, MD [facility] [phone] [facility]     Future Orders Please Complete By Expires   Diet - low sodium heart healthy      Increase activity slowly      Discharge  instructions      Comments:   He'll be discharged to home today with family members. He should call [facility] today to arrange for placement of an event monitor, per the recommendations of Dr. Sherryl Manges, as soon as possible. He should also call Dr. Odessa Fleming office to set up a followup visit within the next 2-3 weeks for review of event monitor findings to see if a pacemaker is warranted. He should also call Dr. Silvano Rusk office today at [phone] to schedule a followup visit next week. He should call Dr. Eloise Harman sooner should he have recurrent problems with dizziness or fainting.   Call MD for:      Comments:   Call Dr. Silvano Rusk office if he develops recurrent episodes of dizziness or fainting, fever, chills, chest pain or persistent neck pain or shortness of breath, or other concerning symptoms.     Medication List  As of 06/24/2012  9:21 AM   TAKE these medications         metoprolol tartrate 25 MG tablet   Commonly known as: LOPRESSOR   Take 25 mg by mouth every 6 (six) hours as needed. For heart rate above 130 ONLY.             Signed: Garlan Fillers  06/24/2012, 9:21 AM

## 2012-06-24 NOTE — Telephone Encounter (Signed)
I spoke with the patient's daughter, Amy. The patient is scheduled for a PPM implant on 06/30/12 at 12:00 pm with Dr. Graciela Husbands.

## 2012-06-24 NOTE — Telephone Encounter (Signed)
Pt is being discharged from [facility] hospital and was told to call office to set up getting pacer placed prior to leaving. Pt is hard of hearing so please contact daughter Amy to schedule this.

## 2012-06-24 NOTE — Progress Notes (Signed)
Discussed discharge instructions with pt, spouse and daughter Amy. Discussed how and when to call the MD, follow up appt, diet, and activity. Verified with Dr Shirlee Latch that patient is supposed to schedule an appt with Dr Odessa Fleming office for placement of a pacemaker and that pt will not go home with an event monitor. Family and pt verbalized understanding and denied any questions. IV dc'd and ECG monitor removed.
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
## 2012-06-24 NOTE — Telephone Encounter (Signed)
Spoke with pt's daughter,Amy. She states pt was told when he was discharged from hospital today that he  needed a pacemaker not an event monitor.  I will forward this to Heather/Dr Graciela Husbands to follow-up with patient/daughter,Amy.

## 2012-06-24 NOTE — Progress Notes (Signed)
Pt insistent on going home and becoming agitated. Phoned MD and daughter to notify on his persistent effort to leave hospital. MD came to bedside to speak with pt and daughter spoke with pt over the phone. Pt agreed to stay and calmed down. Moved pt to nurses station for safety because he is a high fall risk. Will continue to monitor pt closely.  Juliane Lack, RN

## 2012-06-25 ENCOUNTER — Encounter (HOSPITAL_COMMUNITY): Payer: Self-pay | Admitting: Pharmacy Technician

## 2012-06-27 ENCOUNTER — Other Ambulatory Visit: Payer: Self-pay | Admitting: *Deleted

## 2012-06-27 DIAGNOSIS — R001 Bradycardia, unspecified: Secondary | ICD-10-CM

## 2012-06-29 MED ORDER — SODIUM CHLORIDE 0.9 % IV SOLN: 250.0000 mL | INTRAVENOUS | Status: DC

## 2012-06-29 MED ORDER — SODIUM CHLORIDE 0.45 % IV SOLN
INTRAVENOUS | Status: DC
  Administered 2012-06-30: 11:00:00 via INTRAVENOUS

## 2012-06-29 MED ORDER — SODIUM CHLORIDE 0.9 % IR SOLN
80.0000 mg | Status: DC
  Filled 2012-06-29: qty 2

## 2012-06-29 MED ORDER — SODIUM CHLORIDE 0.9 % IJ SOLN: 3.0000 mL | Freq: Two times a day (BID) | INTRAMUSCULAR | Status: DC

## 2012-06-29 MED ORDER — SODIUM CHLORIDE 0.9 % IJ SOLN: 3.0000 mL | INTRAMUSCULAR | Status: DC | PRN

## 2012-06-29 MED ORDER — VANCOMYCIN HCL IN DEXTROSE 1-5 GM/200ML-% IV SOLN
1000.0000 mg | INTRAVENOUS | Status: DC
  Filled 2012-06-29: qty 200

## 2012-06-30 ENCOUNTER — Ambulatory Visit (HOSPITAL_COMMUNITY): Payer: Medicare Other

## 2012-06-30 ENCOUNTER — Observation Stay (HOSPITAL_COMMUNITY): Payer: Medicare Other

## 2012-06-30 ENCOUNTER — Encounter (HOSPITAL_COMMUNITY)
Admission: RE | Disposition: A | Discharge status: Home / Self Care | Payer: Self-pay | Source: Ambulatory Visit | Attending: Internal Medicine

## 2012-06-30 ENCOUNTER — Observation Stay (HOSPITAL_COMMUNITY)
Admission: RE | Admit: 2012-06-30 | Discharge: 2012-07-01 | Disposition: A | Discharge status: Home / Self Care | Payer: Medicare Other | Source: Ambulatory Visit | Attending: Internal Medicine | Admitting: Internal Medicine

## 2012-06-30 DIAGNOSIS — R001 Bradycardia, unspecified: Secondary | ICD-10-CM

## 2012-06-30 DIAGNOSIS — I498 Other specified cardiac arrhythmias: Secondary | ICD-10-CM

## 2012-06-30 DIAGNOSIS — I4891 Unspecified atrial fibrillation: Secondary | ICD-10-CM | POA: Insufficient documentation

## 2012-06-30 DIAGNOSIS — J4489 Other specified chronic obstructive pulmonary disease: Secondary | ICD-10-CM | POA: Insufficient documentation

## 2012-06-30 DIAGNOSIS — J449 Chronic obstructive pulmonary disease, unspecified: Secondary | ICD-10-CM | POA: Insufficient documentation

## 2012-06-30 DIAGNOSIS — I495 Sick sinus syndrome: Principal | ICD-10-CM | POA: Insufficient documentation

## 2012-06-30 HISTORY — PX: PACEMAKER INSERTION: SHX728

## 2012-06-30 HISTORY — PX: PERMANENT PACEMAKER INSERTION: SHX5480

## 2012-06-30 LAB — SURGICAL PCR SCREEN
MRSA, PCR: NEGATIVE
Staphylococcus aureus: NEGATIVE

## 2012-06-30 SURGERY — PERMANENT PACEMAKER INSERTION
Anesthesia: LOCAL

## 2012-06-30 MED ORDER — VANCOMYCIN HCL IN DEXTROSE 1-5 GM/200ML-% IV SOLN
1000.0000 mg | Freq: Two times a day (BID) | INTRAVENOUS | Status: AC
  Administered 2012-06-30: 1000 mg via INTRAVENOUS
  Filled 2012-06-30: qty 200

## 2012-06-30 MED ORDER — VANCOMYCIN HCL IN DEXTROSE 1-5 GM/200ML-% IV SOLN
INTRAVENOUS | Status: AC
  Administered 2012-06-30: 1000 mg via INTRAVENOUS
  Filled 2012-06-30: qty 200

## 2012-06-30 MED ORDER — HEPARIN (PORCINE) IN NACL 2-0.9 UNIT/ML-% IJ SOLN
INTRAMUSCULAR | Status: AC
  Filled 2012-06-30: qty 1000

## 2012-06-30 MED ORDER — METOPROLOL TARTRATE 25 MG PO TABS
25.0000 mg | ORAL_TABLET | Freq: Two times a day (BID) | ORAL | Status: DC
  Administered 2012-06-30 – 2012-07-01 (×2): 25 mg via ORAL
  Filled 2012-06-30 (×4): qty 1

## 2012-06-30 MED ORDER — NICOTINE 14 MG/24HR TD PT24
14.0000 mg | MEDICATED_PATCH | Freq: Every day | TRANSDERMAL | Status: DC
  Administered 2012-06-30 – 2012-07-01 (×2): 14 mg via TRANSDERMAL
  Filled 2012-06-30 (×2): qty 1

## 2012-06-30 MED ORDER — MUPIROCIN 2 % EX OINT
TOPICAL_OINTMENT | Freq: Once | CUTANEOUS | Status: AC
  Administered 2012-06-30: 1 via NASAL

## 2012-06-30 MED ORDER — SODIUM CHLORIDE 0.9 % IV SOLN: INTRAVENOUS | Status: AC

## 2012-06-30 MED ORDER — CHLORHEXIDINE GLUCONATE 4 % EX LIQD
60.0000 mL | Freq: Once | CUTANEOUS | Status: DC
  Filled 2012-06-30: qty 60

## 2012-06-30 MED ORDER — ACETAMINOPHEN 325 MG PO TABS
325.0000 mg | ORAL_TABLET | ORAL | Status: DC | PRN
  Administered 2012-06-30: 650 mg via ORAL
  Filled 2012-06-30: qty 2

## 2012-06-30 MED ORDER — MUPIROCIN 2 % EX OINT
TOPICAL_OINTMENT | CUTANEOUS | Status: AC
  Administered 2012-06-30: 1 via NASAL
  Filled 2012-06-30: qty 22

## 2012-06-30 MED ORDER — MIDAZOLAM HCL 5 MG/5ML IJ SOLN
INTRAMUSCULAR | Status: AC
  Filled 2012-06-30: qty 5

## 2012-06-30 MED ORDER — ONDANSETRON HCL 4 MG/2ML IJ SOLN: 4.0000 mg | Freq: Four times a day (QID) | INTRAMUSCULAR | Status: DC | PRN

## 2012-06-30 MED ORDER — FENTANYL CITRATE 0.05 MG/ML IJ SOLN
INTRAMUSCULAR | Status: AC
  Filled 2012-06-30: qty 2

## 2012-06-30 MED ORDER — LIDOCAINE HCL (PF) 1 % IJ SOLN
INTRAMUSCULAR | Status: AC
  Filled 2012-06-30: qty 60

## 2012-06-30 NOTE — Care Management Note (Signed)
    Page 1 of 1   06/30/2012     4:14:13 PM   CARE MANAGEMENT NOTE 06/30/2012  Patient:  Trevor Brooks, Trevor Brooks   Account Number:  [id number]  Date Initiated:  06/30/2012  Documentation initiated by:  Junius Creamer  Subjective/Objective Assessment:   adm w pacer insertion     Action/Plan:   lives w wife, pcp dr Ivery Quale   Anticipated DC Date:  07/01/2012   Anticipated DC Plan:  HOME/SELF CARE      DC Planning Services  CM consult      Choice offered to / List presented to:             Status of service:   Medicare Important Message given?   (If response is "NO", the following Medicare IM given date fields will be blank) Date Medicare IM given:   Date Additional Medicare IM given:    Discharge Disposition:  HOME/SELF CARE  Per UR Regulation:  Reviewed for med. necessity/level of care/duration of stay  If discussed at Long Length of Stay Meetings, dates discussed:    Comments:  7/8 16:13p debbie Nazirah Tri rn,bsn [phone]

## 2012-06-30 NOTE — Progress Notes (Signed)
Cxr.. No pneumothorax

## 2012-06-30 NOTE — CV Procedure (Addendum)
Preop DX::sinus node dysfunction  Post op DX:: same  Procedure  dual pacemaker implantation/ venogram  Complication Air aspiration X2  After routine prep and drape, lidocaine was infiltrated in the prepectoral subclavicular region on the left side an incision was made and carried down to later the prepectoral fascia using electrocautery and sharp dissection a pocket was formed similarly. Hemostasis was obtained.  After this, we turned our attention to gaining access  to the extrathoracic,left subclavian vein. This was accomplished with  difficulty and with aspiration of air butn not puncture of the artery.  A venogram was then obtained after the first wire as I was unable to pass the wire to SVC  Venogram demonstrated the wire was in a branch of the inominate and repositioning allowed [place] to move it to the IVC  A double wire approach was used and two guidewires were placed and retained and sequentially 7 French sheath through which were  passed an Medtronic [id number] ventricular lead serial number [id number] and an [id number] atrial lead serial number [id number] .  The ventricular lead was manipulated to the right ventricular apex with a bipolar R wave was 7.2, the pacing impedance was 1037, the threshold was 0.7 @ 0.5 msec  Current at threshold was   0.7  Ma and the current of injury was  brisk.  The right atrial lead was manipulated to the right atrial appendage with a bipolar P-wave  5.9, the pacing impedance was 769, the threshold 1@ 0.5 msec   Current at threshold was 1.3  Ma and the current of injury was brisk.  The ventricular lead was marked with a tie prior to the insertion of the atrial lead. The leads were affixed to the prepectoral fascia and attached to a  Medtronicadapta L  pulse generator serial number [id number] .  Hemostasis was obtained. The pocket was copiously irrigated with antibiotic containing saline solution. The leads and the pulse generator were placed in the pocket and  affixed to the prepectoral fascia. The wound was then closed in 3 layers in the normal fashion. The wound was washed dried and a benzoin Steri-Strip dressing was applied.  Needle  Count, sponge counts and instrument counts were correct at the end of the procedure .   The patient tolerated the procedure ; floursocopy of lung apex was normal,  O2 sats were stable  CXR pending  Gerlene Burdock.D.
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
## 2012-06-30 NOTE — Interval H&P Note (Signed)
History and Physical Interval Note:  06/30/2012 11:35 AM  Trevor Brooks  has presented today for surgery, with the diagnosis of Sinus Node Dysfunction  The various methods of treatment have been discussed with the patient and family. After consideration of risks, benefits and other options for treatment, the patient has consented to  Procedure(s) (LRB): PERMANENT PACEMAKER INSERTION (N/A) as a surgical intervention .  The patient's history has been reviewed, patient examined, no change in status, stable for surgery.  I have reviewed the patients' chart and labs.  Questions were answered to the patient's satisfaction.     Sherryl Manges

## 2012-07-01 ENCOUNTER — Observation Stay (HOSPITAL_COMMUNITY): Payer: Medicare Other

## 2012-07-01 ENCOUNTER — Encounter (HOSPITAL_COMMUNITY): Payer: Self-pay | Admitting: *Deleted
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
## 2012-07-01 NOTE — Progress Notes (Signed)
Discharge instructions given to pt and to fm and explained thoroughly; both fm and pt verbalized understanding d/c instructions and follow-up appointments. Pts vital signs stable upon d/c and pt denies having any pain at the time of d/c.

## 2012-07-01 NOTE — Progress Notes (Signed)
   ELECTROPHYSIOLOGY ROUNDING NOTE    Patient Name: Trevor Brooks Date of Encounter: 07-01-2012    SUBJECTIVE:Patient feels well.  No chest pain or shortness of breath.  S/p dual chamber pacemaker 06-30-2012.  Confused last night per nursing.    TELEMETRY: Reviewed telemetry pt in sinus rhythm with occasional atrial pacing Filed Vitals:   06/30/12 2344 07/01/12 0000 07/01/12 0351 07/01/12 0400  BP:    111/46  Pulse:  68    Temp: 98.4 F (36.9 C)  97.4 F (36.3 C)   TempSrc: Oral  Oral   Resp:  16    Height:      Weight:    122 lb 5.7 oz (55.5 kg)  SpO2:  96%      Intake/Output Summary (Last 24 hours) at 07/01/12 0744 Last data filed at 07/01/12 0700  Gross per 24 hour  Intake    880 ml  Output    500 ml  Net    380 ml   Radiology/Studies:  Dg Chest 1v Repeat Same Day 06/30/2012  *RADIOLOGY REPORT*  Clinical Data: Recent pacer insertion.  CHEST - 1 VIEW SAME DAY  Comparison: 06/30/2012  Findings: Left subclavian two lead pacer noted.  No pneumothorax demonstrated.  Stable heart size and vascularity.  No focal pneumonia, collapse, consolidation, edema, or effusion. Atherosclerosis of the aorta.  IMPRESSION: Negative for pneumothorax.  Original Report Authenticated By: Judie Petit. Ruel Favors, M.D.   Dg Chest Port 1 View 06/30/2012  *RADIOLOGY REPORT*  Clinical Data: Pacemaker insertion, evaluate for pneumothorax  PORTABLE CHEST - 1 VIEW  Comparison: Chest x-ray of 06/22/2012  Findings: The lungs remain hyperaerated.  However no pneumothorax is seen.  No effusion is noted.  A dual lead permanent pacemaker is present.  The heart is within upper limits normal in size.  IMPRESSION: A permanent pacemaker is now present.  No pneumothorax.  The lungs remain hyperaerated.  Original Report Authenticated By: Juline Patch, M.D.   CXR from this morning- Final result pending, leads in stable position.  PHYSICAL EXAM Left chest without hematoma or ecchymosis Well developed and nourished in no acute  distress HENT normal Neck supple with JVP-flat Clear Regular rate and rhythm, no murmurs or gallops Abd-soft with active BS No Clubbing cyanosis edema Skin-warm and dry A  Grossly normal sensory and motor function   DEVICE INTERROGATION: Device interrogated by industry.  Lead values including impedence, sensing, threshold within normal values.    Wound care, arm mobility, restrictions reviewed with patient.  Routine follow up scheduled.  Discharge plans per Dr Graciela Husbands.   F/iu with DM as scheuduled

## 2012-07-01 NOTE — Progress Notes (Signed)
Orthopedic Tech Progress Note Patient Details:  Trevor Brooks 05/06/1922 [id number]  Patient ID: [name], male   DOB: 05/21/1922, 78 y.o.   MRN: [id number]   Shawnie Pons 07/01/2012, 9:55 AM PT HAS ARM SLING ON

## 2012-07-09 ENCOUNTER — Telehealth: Payer: Self-pay | Admitting: Internal Medicine

## 2012-07-09 NOTE — Telephone Encounter (Signed)
New msg Pt's wife called and said he had pacemaker put in and bandage has come off. She is worried about steri strips. Please call

## 2012-07-09 NOTE — Telephone Encounter (Signed)
Spoke w/pt's wife---informed not to take off strips. Strips will be removed 07-10-12 at wound check. Pt understands and aware of appt.

## 2012-07-10 ENCOUNTER — Ambulatory Visit (INDEPENDENT_AMBULATORY_CARE_PROVIDER_SITE_OTHER): Payer: Medicare Other | Admitting: *Deleted

## 2012-07-10 ENCOUNTER — Encounter: Payer: Self-pay | Admitting: Internal Medicine

## 2012-07-10 DIAGNOSIS — I498 Other specified cardiac arrhythmias: Secondary | ICD-10-CM

## 2012-07-10 LAB — PACEMAKER DEVICE OBSERVATION
AL AMPLITUDE: 4 mv
BAMS-0001: 150 {beats}/min
RV LEAD AMPLITUDE: 11.2 mv
RV LEAD THRESHOLD: 0.75 V
VENTRICULAR PACING PM: 0

## 2012-07-10 NOTE — Progress Notes (Signed)
Wound check-PPM 

## 2012-09-02 ENCOUNTER — Ambulatory Visit (INDEPENDENT_AMBULATORY_CARE_PROVIDER_SITE_OTHER): Payer: Medicare Other | Admitting: Cardiology

## 2012-09-02 ENCOUNTER — Encounter: Payer: Self-pay | Admitting: Cardiology

## 2012-09-02 VITALS — BP 110/56 | HR 71 | Ht 70.0 in | Wt 130.0 lb

## 2012-09-02 DIAGNOSIS — K4090 Unilateral inguinal hernia, without obstruction or gangrene, not specified as recurrent: Secondary | ICD-10-CM

## 2012-09-02 DIAGNOSIS — I495 Sick sinus syndrome: Secondary | ICD-10-CM

## 2012-09-02 DIAGNOSIS — R55 Syncope and collapse: Secondary | ICD-10-CM

## 2012-09-02 DIAGNOSIS — I4892 Unspecified atrial flutter: Secondary | ICD-10-CM

## 2012-09-02 DIAGNOSIS — K469 Unspecified abdominal hernia without obstruction or gangrene: Secondary | ICD-10-CM

## 2012-09-02 MED ORDER — METOPROLOL TARTRATE 25 MG PO TABS: ORAL_TABLET | ORAL | Status: DC

## 2012-09-02 NOTE — Patient Instructions (Addendum)
Take  Metoprolol 12.5mg  two times a day. This will be one-half 25mg  tablet two times a day.  You have been referred to Dr Dwain Sarna to evaluate your hernia.  Your physician wants you to follow-up in: 6 months with Dr Shirlee Latch. (March 2014). You will receive a reminder letter in the mail two months in advance. If you don't receive a letter, please call our office to schedule the follow-up appointment.

## 2012-09-03 DIAGNOSIS — K4090 Unilateral inguinal hernia, without obstruction or gangrene, not specified as recurrent: Secondary | ICD-10-CM | POA: Insufficient documentation
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
## 2012-09-26 ENCOUNTER — Encounter (INDEPENDENT_AMBULATORY_CARE_PROVIDER_SITE_OTHER): Payer: Medicare Other | Admitting: General Surgery

## 2012-10-02 ENCOUNTER — Telehealth (INDEPENDENT_AMBULATORY_CARE_PROVIDER_SITE_OTHER): Payer: Self-pay

## 2012-10-02 NOTE — Telephone Encounter (Signed)
Trevor Brooks, He needs to come to urgent office or be seen much sooner.  He doesn't need to wait until 15th. If this hurts that much and is not reducible he needs to go to er. MW

## 2012-10-02 NOTE — Telephone Encounter (Signed)
Spoke with the patient's daughter today.  She says her father is having a lot of pain from the hernia.  It is no longer reducable.  He is scheduled as a new patient to see Dr. Dwain Sarna, but she does not want him to wait that long.  Advised her I would talk to the urgent office physician today and let her know how to proceed.

## 2012-10-02 NOTE — Telephone Encounter (Signed)
The patient needs to apply warm compresses to his abdomen, refrain from any strenuous activity, and make arrangements for cardiac clearance.  If he becomes worse in the next few days; nausea/vomiting, he will need to go to the ED.  His daughter is aware.  Also, an Rx for an abdominal binder was faxed to [facility].  Pt will keep his appointment with Dr. Dwain Sarna on 10/07/12.

## 2012-10-03 ENCOUNTER — Ambulatory Visit (INDEPENDENT_AMBULATORY_CARE_PROVIDER_SITE_OTHER): Payer: Medicare Other | Admitting: Surgery

## 2012-10-03 ENCOUNTER — Encounter (INDEPENDENT_AMBULATORY_CARE_PROVIDER_SITE_OTHER): Payer: Self-pay | Admitting: Surgery

## 2012-10-03 VITALS — BP 130/60 | HR 60 | Temp 97.7°F | Resp 16 | Ht 70.0 in | Wt 130.6 lb

## 2012-10-03 DIAGNOSIS — K4090 Unilateral inguinal hernia, without obstruction or gangrene, not specified as recurrent: Secondary | ICD-10-CM

## 2012-10-03 NOTE — Patient Instructions (Signed)
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
## 2012-10-07 ENCOUNTER — Encounter (INDEPENDENT_AMBULATORY_CARE_PROVIDER_SITE_OTHER): Payer: Medicare Other | Admitting: General Surgery

## 2012-10-08 ENCOUNTER — Encounter: Payer: Self-pay | Admitting: *Deleted

## 2012-10-08 DIAGNOSIS — Z950 Presence of cardiac pacemaker: Secondary | ICD-10-CM | POA: Insufficient documentation

## 2012-10-14 ENCOUNTER — Encounter: Payer: Self-pay | Admitting: Internal Medicine

## 2012-10-14 ENCOUNTER — Ambulatory Visit (INDEPENDENT_AMBULATORY_CARE_PROVIDER_SITE_OTHER): Payer: Medicare Other | Admitting: Internal Medicine

## 2012-10-14 VITALS — BP 83/52 | HR 59 | Ht 70.0 in | Wt 131.8 lb

## 2012-10-14 DIAGNOSIS — I495 Sick sinus syndrome: Secondary | ICD-10-CM

## 2012-10-14 DIAGNOSIS — R55 Syncope and collapse: Secondary | ICD-10-CM

## 2012-10-14 DIAGNOSIS — Z950 Presence of cardiac pacemaker: Secondary | ICD-10-CM

## 2012-10-14 DIAGNOSIS — K4090 Unilateral inguinal hernia, without obstruction or gangrene, not specified as recurrent: Secondary | ICD-10-CM

## 2012-10-14 DIAGNOSIS — I959 Hypotension, unspecified: Secondary | ICD-10-CM

## 2012-10-14 LAB — PACEMAKER DEVICE OBSERVATION
AL AMPLITUDE: 4 mv
BATTERY VOLTAGE: 2.8 V
RV LEAD IMPEDENCE PM: 530 Ohm
VENTRICULAR PACING PM: 0

## 2012-10-14 NOTE — Assessment & Plan Note (Signed)
Atrially paced 11%

## 2012-10-14 NOTE — Assessment & Plan Note (Signed)
The patient's device was interrogated.  The information was reviewed. Changes were made in outputs to maximize longevity 
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
## 2012-10-14 NOTE — Assessment & Plan Note (Signed)
We are glad to reason for surgery.  he should be an acceptable risk

## 2012-10-14 NOTE — Patient Instructions (Signed)
Your physician wants you to follow-up in: 9 months with Dr. Klein.  You will receive a reminder letter in the mail two months in advance. If you don't receive a letter, please call our office to schedule the follow-up appointment.  

## 2012-10-14 NOTE — Assessment & Plan Note (Signed)
Low but wife keeps records of this  Will follow and then consider the use of ProAmatine. Some of his somnolence may be related to this.

## 2012-10-14 NOTE — Assessment & Plan Note (Signed)
No recurrent syncope 

## 2012-10-22 ENCOUNTER — Encounter (HOSPITAL_COMMUNITY): Payer: Self-pay | Admitting: Pharmacy Technician

## 2012-10-27 ENCOUNTER — Encounter (HOSPITAL_COMMUNITY)
Admission: RE | Admit: 2012-10-27 | Discharge: 2012-10-27 | Disposition: A | Discharge status: Home / Self Care | Payer: Medicare Other | Source: Ambulatory Visit | Attending: Surgery | Admitting: Surgery

## 2012-10-27 ENCOUNTER — Encounter (HOSPITAL_COMMUNITY): Payer: Self-pay

## 2012-10-27 HISTORY — DX: Unspecified hearing loss, unspecified ear: H91.90

## 2012-10-27 HISTORY — DX: Nontraumatic subdural hemorrhage, unspecified: I62.00

## 2012-10-27 HISTORY — DX: Unspecified osteoarthritis, unspecified site: M19.90

## 2012-10-27 LAB — BASIC METABOLIC PANEL
CO2: 31 mEq/L (ref 19–32)
Chloride: 100 mEq/L (ref 96–112)
GFR calc Af Amer: 60 mL/min — ABNORMAL LOW (ref >90)
Potassium: 4.9 mEq/L (ref 3.5–5.1)

## 2012-10-27 LAB — SURGICAL PCR SCREEN
MRSA, PCR: NEGATIVE
Staphylococcus aureus: NEGATIVE

## 2012-10-27 LAB — CBC
HCT: 43.6 % (ref 39.0–52.0)
MCV: 86.5 fL (ref 78.0–100.0)
Platelets: 178 10*3/uL (ref 150–400)
RBC: 5.04 MIL/uL (ref 4.22–5.81)
RDW: 13.9 % (ref 11.5–15.5)
WBC: 7.4 10*3/uL (ref 4.0–10.5)

## 2012-10-27 NOTE — Progress Notes (Signed)
Spoke with Jola Babinski at [employer] and gave patient info regarding need to schedule rep here for day of surgery at 1650- states will have local rep return my call

## 2012-10-27 NOTE — Progress Notes (Signed)
OV with [facility], last interrogation EPIC  Dr Graciela Husbands 10/14/12, LOV Dr Shirlee Latch 09/02/12 EPIC,  Chest 6/13.7/13,ekg 10/13 EPIC

## 2012-10-27 NOTE — Patient Instructions (Addendum)
20 Trevor Brooks  10/27/2012   Your procedure is scheduled on:10/31/12  Friday    Surgery 1230-1400    Report to [facility] at   1000    AM.  Call this number if you have problems the morning of surgery: [phone]       Remember:   Do not eat food  Or drink :After Midnight.   Take these medicines the morning of surgery with A SIP OF WATER:    METOPROLOL   FLEETS ENEMA PER RECTUM NIGHT BEFORE SURGERY ONCE AS PER DR MARTIN ORDER.  Contacts, dentures or partial plates can not be worn to surgery  Leave suitcase in the car. After surgery it may be brought to your room.  For patients admitted to the hospital, checkout time is 11:00 AM day of  discharge.             SPECIAL INSTRUCTIONS- SEE [place] PREPARING FOR SURGERY INSTRUCTION SHEET-     DO NOT WEAR JEWELRY, LOTIONS, POWDERS, OR PERFUMES.  WOMEN-- DO NOT SHAVE LEGS OR UNDERARMS FOR 12 HOURS BEFORE SHOWERS. MEN MAY SHAVE FACE.  Patients discharged the day of surgery will not be allowed to drive home. IF going home the day of surgery, you must have a driver and someone to stay with you for the first 24 hours  Name and phone number of your driver:  Family after overnight stay                                                                      Please read over the following fact sheets that you were given: MRSA Information, Incentive Spirometry Sheet, Blood Transfusion Sheet  Information                                                                                   [name]  PST [phone]

## 2012-10-27 NOTE — Progress Notes (Signed)
NOTIFIED MARCIA HAYES, [employer] REP OR SURGERY DAY, DATE and TIMES- orders read to her- stated someone would be here for surgery

## 2012-10-31 ENCOUNTER — Encounter (HOSPITAL_COMMUNITY): Payer: Self-pay | Admitting: Anesthesiology

## 2012-10-31 ENCOUNTER — Ambulatory Visit (HOSPITAL_COMMUNITY)
Admission: RE | Admit: 2012-10-31 | Discharge: 2012-11-01 | Disposition: A | Discharge status: Home / Self Care | Payer: Medicare Other | Source: Ambulatory Visit | Attending: Surgery | Admitting: Surgery

## 2012-10-31 ENCOUNTER — Ambulatory Visit (HOSPITAL_COMMUNITY): Payer: Medicare Other | Admitting: Anesthesiology

## 2012-10-31 ENCOUNTER — Encounter (HOSPITAL_COMMUNITY): Payer: Self-pay

## 2012-10-31 ENCOUNTER — Encounter (HOSPITAL_COMMUNITY)
Admission: RE | Disposition: A | Discharge status: Home / Self Care | Payer: Self-pay | Source: Ambulatory Visit | Attending: Surgery

## 2012-10-31 DIAGNOSIS — I959 Hypotension, unspecified: Secondary | ICD-10-CM | POA: Insufficient documentation

## 2012-10-31 DIAGNOSIS — Z79899 Other long term (current) drug therapy: Secondary | ICD-10-CM | POA: Insufficient documentation

## 2012-10-31 DIAGNOSIS — I495 Sick sinus syndrome: Secondary | ICD-10-CM | POA: Insufficient documentation

## 2012-10-31 DIAGNOSIS — R609 Edema, unspecified: Secondary | ICD-10-CM | POA: Insufficient documentation

## 2012-10-31 DIAGNOSIS — R55 Syncope and collapse: Secondary | ICD-10-CM | POA: Insufficient documentation

## 2012-10-31 DIAGNOSIS — K4091 Unilateral inguinal hernia, without obstruction or gangrene, recurrent: Secondary | ICD-10-CM | POA: Insufficient documentation

## 2012-10-31 DIAGNOSIS — K4090 Unilateral inguinal hernia, without obstruction or gangrene, not specified as recurrent: Secondary | ICD-10-CM

## 2012-10-31 DIAGNOSIS — I517 Cardiomegaly: Secondary | ICD-10-CM | POA: Insufficient documentation

## 2012-10-31 DIAGNOSIS — J4489 Other specified chronic obstructive pulmonary disease: Secondary | ICD-10-CM | POA: Insufficient documentation

## 2012-10-31 DIAGNOSIS — R0789 Other chest pain: Secondary | ICD-10-CM | POA: Insufficient documentation

## 2012-10-31 DIAGNOSIS — I4892 Unspecified atrial flutter: Secondary | ICD-10-CM | POA: Insufficient documentation

## 2012-10-31 DIAGNOSIS — J449 Chronic obstructive pulmonary disease, unspecified: Secondary | ICD-10-CM | POA: Insufficient documentation

## 2012-10-31 DIAGNOSIS — Z01812 Encounter for preprocedural laboratory examination: Secondary | ICD-10-CM | POA: Insufficient documentation

## 2012-10-31 DIAGNOSIS — I4891 Unspecified atrial fibrillation: Secondary | ICD-10-CM | POA: Insufficient documentation

## 2012-10-31 HISTORY — PX: INGUINAL HERNIA REPAIR: SHX194

## 2012-10-31 LAB — CBC
HCT: 39.5 % (ref 39.0–52.0)
Hemoglobin: 13.2 g/dL (ref 13.0–17.0)
MCH: 28.8 pg (ref 26.0–34.0)
MCHC: 33.4 g/dL (ref 30.0–36.0)
MCV: 86.1 fL (ref 78.0–100.0)

## 2012-10-31 SURGERY — REPAIR, HERNIA, INGUINAL, ADULT
Anesthesia: General | Site: Abdomen | Laterality: Right | Wound class: Clean

## 2012-10-31 MED ORDER — ONDANSETRON HCL 4 MG/2ML IJ SOLN: 4.0000 mg | Freq: Four times a day (QID) | INTRAMUSCULAR | Status: DC | PRN

## 2012-10-31 MED ORDER — FENTANYL CITRATE 0.05 MG/ML IJ SOLN: 25.0000 ug | INTRAMUSCULAR | Status: DC | PRN

## 2012-10-31 MED ORDER — KCL IN DEXTROSE-NACL 20-5-0.45 MEQ/L-%-% IV SOLN
INTRAVENOUS | Status: DC
  Administered 2012-10-31: 17:00:00 via INTRAVENOUS
  Filled 2012-10-31 (×3): qty 1000

## 2012-10-31 MED ORDER — CIPROFLOXACIN IN D5W 400 MG/200ML IV SOLN
400.0000 mg | INTRAVENOUS | Status: AC
  Administered 2012-10-31: 400 mg via INTRAVENOUS

## 2012-10-31 MED ORDER — MEPERIDINE HCL 50 MG/ML IJ SOLN: 6.2500 mg | INTRAMUSCULAR | Status: DC | PRN

## 2012-10-31 MED ORDER — ACETAMINOPHEN 325 MG PO TABS
650.0000 mg | ORAL_TABLET | ORAL | Status: DC | PRN
  Administered 2012-11-01: 650 mg via ORAL
  Filled 2012-10-31: qty 2

## 2012-10-31 MED ORDER — HEPARIN SODIUM (PORCINE) 5000 UNIT/ML IJ SOLN
5000.0000 [IU] | Freq: Three times a day (TID) | INTRAMUSCULAR | Status: DC
  Administered 2012-11-01: 5000 [IU] via SUBCUTANEOUS
  Filled 2012-10-31 (×4): qty 1

## 2012-10-31 MED ORDER — LIDOCAINE HCL (CARDIAC) 20 MG/ML IV SOLN
INTRAVENOUS | Status: DC | PRN
  Administered 2012-10-31: 50 mg via INTRAVENOUS

## 2012-10-31 MED ORDER — BUPIVACAINE LIPOSOME 1.3 % IJ SUSP
20.0000 mL | Freq: Once | INTRAMUSCULAR | Status: DC
  Filled 2012-10-31: qty 20

## 2012-10-31 MED ORDER — ACETAMINOPHEN 10 MG/ML IV SOLN
INTRAVENOUS | Status: DC | PRN
  Administered 2012-10-31: 1000 mg via INTRAVENOUS

## 2012-10-31 MED ORDER — LACTATED RINGERS IV SOLN
INTRAVENOUS | Status: DC | PRN
  Administered 2012-10-31: 11:00:00 via INTRAVENOUS

## 2012-10-31 MED ORDER — BUPIVACAINE HCL 0.5 % IJ SOLN
INTRAMUSCULAR | Status: DC | PRN
  Administered 2012-10-31: 10 mL

## 2012-10-31 MED ORDER — HEPARIN SODIUM (PORCINE) 5000 UNIT/ML IJ SOLN
5000.0000 [IU] | Freq: Three times a day (TID) | INTRAMUSCULAR | Status: DC
  Filled 2012-10-31: qty 1

## 2012-10-31 MED ORDER — PROPOFOL 10 MG/ML IV BOLUS
INTRAVENOUS | Status: DC | PRN
  Administered 2012-10-31: 170 mg via INTRAVENOUS
  Administered 2012-10-31: 30 mg via INTRAVENOUS

## 2012-10-31 MED ORDER — PROMETHAZINE HCL 25 MG/ML IJ SOLN: 6.2500 mg | INTRAMUSCULAR | Status: DC | PRN

## 2012-10-31 MED ORDER — MORPHINE SULFATE 10 MG/ML IJ SOLN
1.0000 mg | INTRAMUSCULAR | Status: DC | PRN
  Administered 2012-11-01: 1 mg via INTRAVENOUS
  Filled 2012-10-31 (×2): qty 1

## 2012-10-31 MED ORDER — PHENYLEPHRINE HCL 10 MG/ML IJ SOLN
10.0000 mg | INTRAMUSCULAR | Status: DC | PRN
  Administered 2012-10-31: 10 ug/min via INTRAVENOUS

## 2012-10-31 MED ORDER — FENTANYL CITRATE 0.05 MG/ML IJ SOLN
INTRAMUSCULAR | Status: DC | PRN
  Administered 2012-10-31: 50 ug via INTRAVENOUS

## 2012-10-31 MED ORDER — HEPARIN SODIUM (PORCINE) 5000 UNIT/ML IJ SOLN
5000.0000 [IU] | Freq: Once | INTRAMUSCULAR | Status: AC
  Administered 2012-10-31: 5000 [IU] via SUBCUTANEOUS

## 2012-10-31 MED ORDER — ROCURONIUM BROMIDE 100 MG/10ML IV SOLN
INTRAVENOUS | Status: DC | PRN
  Administered 2012-10-31: 25 mg via INTRAVENOUS

## 2012-10-31 MED ORDER — HYDROCODONE-ACETAMINOPHEN 5-325 MG PO TABS
1.0000 | ORAL_TABLET | ORAL | Status: DC | PRN
  Administered 2012-10-31: 1 via ORAL
  Filled 2012-10-31: qty 1

## 2012-10-31 MED ORDER — NEOSTIGMINE METHYLSULFATE 1 MG/ML IJ SOLN
INTRAMUSCULAR | Status: DC | PRN
  Administered 2012-10-31: 3 mg via INTRAVENOUS

## 2012-10-31 MED ORDER — LACTATED RINGERS IV SOLN
INTRAVENOUS | Status: DC
  Administered 2012-10-31: 1000 mL via INTRAVENOUS

## 2012-10-31 MED ORDER — ONDANSETRON HCL 4 MG/2ML IJ SOLN
INTRAMUSCULAR | Status: DC | PRN
  Administered 2012-10-31: 4 mg via INTRAVENOUS

## 2012-10-31 MED ORDER — PHENYLEPHRINE HCL 10 MG/ML IJ SOLN
INTRAMUSCULAR | Status: DC | PRN
  Administered 2012-10-31 (×2): 80 ug via INTRAVENOUS
  Administered 2012-10-31: 40 ug via INTRAVENOUS
  Administered 2012-10-31: 80 ug via INTRAVENOUS

## 2012-10-31 MED ORDER — METOPROLOL TARTRATE 12.5 MG HALF TABLET
12.5000 mg | ORAL_TABLET | Freq: Two times a day (BID) | ORAL | Status: DC
  Administered 2012-10-31: 12.5 mg via ORAL
  Filled 2012-10-31 (×3): qty 1

## 2012-10-31 MED ORDER — GLYCOPYRROLATE 0.2 MG/ML IJ SOLN
INTRAMUSCULAR | Status: DC | PRN
  Administered 2012-10-31: 0.4 mg via INTRAVENOUS

## 2012-10-31 MED ORDER — ONDANSETRON HCL 4 MG PO TABS: 4.0000 mg | ORAL_TABLET | Freq: Four times a day (QID) | ORAL | Status: DC | PRN

## 2012-10-31 MED ORDER — LACTATED RINGERS IV SOLN: INTRAVENOUS | Status: DC

## 2012-10-31 SURGICAL SUPPLY — 43 items
BENZOIN TINCTURE PRP APPL 2/3 (GAUZE/BANDAGES/DRESSINGS) ×2 IMPLANT
BLADE HEX COATED 2.75 (ELECTRODE) ×2 IMPLANT
BLADE SURG 15 STRL LF DISP TIS (BLADE) ×1 IMPLANT
BLADE SURG 15 STRL SS (BLADE) ×1
BLADE SURG SZ10 CARB STEEL (BLADE) ×2 IMPLANT
CANISTER SUCTION 2500CC (MISCELLANEOUS) ×2 IMPLANT
CLOTH BEACON ORANGE TIMEOUT ST (SAFETY) ×2 IMPLANT
DECANTER SPIKE VIAL GLASS SM (MISCELLANEOUS) ×2 IMPLANT
DERMABOND ADVANCED (GAUZE/BANDAGES/DRESSINGS) ×1
DERMABOND ADVANCED .7 DNX12 (GAUZE/BANDAGES/DRESSINGS) ×1 IMPLANT
DISSECTOR ROUND CHERRY 3/8 STR (MISCELLANEOUS) ×2 IMPLANT
DRAIN PENROSE 18X1/2 LTX STRL (DRAIN) ×2 IMPLANT
DRAPE LAPAROTOMY TRNSV 102X78 (DRAPE) ×2 IMPLANT
DRSG TEGADERM 2-3/8X2-3/4 SM (GAUZE/BANDAGES/DRESSINGS) IMPLANT
ELECT REM PT RETURN 9FT ADLT (ELECTROSURGICAL) ×2
ELECTRODE REM PT RTRN 9FT ADLT (ELECTROSURGICAL) ×1 IMPLANT
GAUZE SPONGE 4X4 16PLY XRAY LF (GAUZE/BANDAGES/DRESSINGS) IMPLANT
GLOVE BIOGEL M 8.0 STRL (GLOVE) ×2 IMPLANT
GLOVE BIOGEL PI IND STRL 7.0 (GLOVE) ×1 IMPLANT
GLOVE BIOGEL PI INDICATOR 7.0 (GLOVE) ×1
GOWN STRL NON-REIN LRG LVL3 (GOWN DISPOSABLE) ×4 IMPLANT
GOWN STRL REIN XL XLG (GOWN DISPOSABLE) ×4 IMPLANT
KIT BASIN OR (CUSTOM PROCEDURE TRAY) ×2 IMPLANT
MESH HERNIA 1X4 RECT BARD (Mesh General) ×1 IMPLANT
MESH HERNIA BARD 1X4 (Mesh General) ×1 IMPLANT
NEEDLE HYPO 25X1 1.5 SAFETY (NEEDLE) ×2 IMPLANT
NS IRRIG 1000ML POUR BTL (IV SOLUTION) ×2 IMPLANT
PACK BASIC VI WITH GOWN DISP (CUSTOM PROCEDURE TRAY) ×2 IMPLANT
PENCIL BUTTON HOLSTER BLD 10FT (ELECTRODE) ×2 IMPLANT
SPONGE GAUZE 4X4 12PLY (GAUZE/BANDAGES/DRESSINGS) ×2 IMPLANT
SPONGE LAP 4X18 X RAY DECT (DISPOSABLE) ×4 IMPLANT
STAPLER VISISTAT 35W (STAPLE) IMPLANT
STRIP CLOSURE SKIN 1/2X4 (GAUZE/BANDAGES/DRESSINGS) ×2 IMPLANT
SUT PROLENE 2 0 CT2 30 (SUTURE) ×6 IMPLANT
SUT SILK 2 0 SH (SUTURE) ×2 IMPLANT
SUT SILK 2 0 SH CR/8 (SUTURE) ×2 IMPLANT
SUT SURGILON 0 BLK (SUTURE) IMPLANT
SUT VIC AB 2-0 SH 27 (SUTURE) ×2
SUT VIC AB 2-0 SH 27X BRD (SUTURE) ×2 IMPLANT
SUT VIC AB 4-0 SH 18 (SUTURE) ×2 IMPLANT
SYR BULB IRRIGATION 50ML (SYRINGE) ×2 IMPLANT
SYR CONTROL 10ML LL (SYRINGE) ×2 IMPLANT
YANKAUER SUCT BULB TIP 10FT TU (MISCELLANEOUS) ×2 IMPLANT

## 2012-10-31 NOTE — Anesthesia Preprocedure Evaluation (Addendum)
Anesthesia Evaluation  Patient identified by MRN, date of birth, ID band Patient awake    Reviewed: Allergy & Precautions, H&P , NPO status , Patient's Chart, lab work & pertinent test results  Airway Mallampati: II TM Distance: >3 FB Neck ROM: Full    Dental No notable dental hx. (+) Edentulous Upper and Edentulous Lower   Pulmonary neg pulmonary ROS, COPD breath sounds clear to auscultation  Pulmonary exam normal       Cardiovascular negative cardio ROS  + dysrhythmias Atrial Fibrillation + pacemaker Rhythm:Regular Rate:Normal     Neuro/Psych negative neurological ROS  negative psych ROS   GI/Hepatic negative GI ROS, Neg liver ROS,   Endo/Other  negative endocrine ROS  Renal/GU negative Renal ROS  negative genitourinary   Musculoskeletal negative musculoskeletal ROS (+)   Abdominal   Peds negative pediatric ROS (+)  Hematology negative hematology ROS (+)   Anesthesia Other Findings   Reproductive/Obstetrics negative OB ROS                           Anesthesia Physical Anesthesia Plan  ASA: III  Anesthesia Plan: General   Post-op Pain Management:    Induction: Intravenous  Airway Management Planned: Oral ETT and LMA  Additional Equipment:   Intra-op Plan:   Post-operative Plan: Extubation in OR  Informed Consent: I have reviewed the patients History and Physical, chart, labs and discussed the procedure including the risks, benefits and alternatives for the proposed anesthesia with the patient or authorized representative who has indicated his/her understanding and acceptance.   Dental advisory given  Plan Discussed with: CRNA  Anesthesia Plan Comments:         Anesthesia Quick Evaluation
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
## 2012-10-31 NOTE — Op Note (Signed)
Surgeon: Wenda Low, MD, FACS  Asst:  none  Anes:  general  Procedure: Open right inguinal hernia for recurrence  Diagnosis: Prior Halsted II repair of hernia with recurrence  Complications: none  EBL:   4 cc  Description of Procedure:  The patient was taken to OR 1 on Friday, 10/31/12 and given general anesthesia.  The abdomen was prepped with PCMX and draped sterilely. The right inguinal side was identified he had had a prior surgery way back when he was a young man probably 60 years ago. As a later discovered this was a Insurance risk surveyor 2 type repair. I made an oblique incision and carried down to his rather scant subcutaneous tissue and discover the hernia. I separated it from the cord structures and found a narrow neck that would accommodate my index finger. A performed a high ligation of the sac with 2-0 silk and after reducing it completely freed the floor and then closed this with 3 interrupted 0 Prolene sutures leaving enough room for the cord structures to exist without being too tight.  A piece of Marlex type mesh was then cut and placed around the cord structures and sutured to the external oblique fascia with interrupted 2-0 Prolene. Subcutaneous tissue was closed with interrupted 4-0 Vicryl's in the subcuticularly as well and the skin was closed with Dermabond. Prior to closure was injected with half percent Marcaine. The patient was then taken to the recovery room and will be kept overnight for observation.  Matt B. Daphine Deutscher, MD, [facility], [place] [phone]

## 2012-10-31 NOTE — Transfer of Care (Signed)
Immediate Anesthesia Transfer of Care Note  Patient: Trevor Brooks  Procedure(s) Performed: Procedure(s) (LRB) with comments: HERNIA REPAIR INGUINAL ADULT (Right)  Patient Location: PACU  Anesthesia Type:General  Level of Consciousness: sedated, patient cooperative and responds to stimulation  Airway & Oxygen Therapy: Patient Spontanous Breathing and Patient connected to face mask oxygen  Post-op Assessment: Report given to PACU RN and Post -op Vital signs reviewed and stable  Post vital signs: Reviewed and stable  Complications: No apparent anesthesia complications

## 2012-10-31 NOTE — Preoperative (Signed)
Beta Blockers   Reason not to administer Beta Blockers:Not Applicable 

## 2012-10-31 NOTE — Anesthesia Postprocedure Evaluation (Signed)
  Anesthesia Post-op Note  Patient: Trevor Brooks  Procedure(s) Performed: Procedure(s) (LRB): HERNIA REPAIR INGUINAL ADULT (Right)  Patient Location: PACU  Anesthesia Type: General  Level of Consciousness: awake and alert   Airway and Oxygen Therapy: Patient Spontanous Breathing  Post-op Pain: mild  Post-op Assessment: Post-op Vital signs reviewed, Patient's Cardiovascular Status Stable, Respiratory Function Stable, Patent Airway and No signs of Nausea or vomiting  Post-op Vital Signs: stable  Complications: No apparent anesthesia complications

## 2012-10-31 NOTE — Interval H&P Note (Signed)
History and Physical Interval Note:  10/31/2012 12:13 PM  Trevor Brooks  has presented today for surgery, with the diagnosis of Right inguinal hernia  The various methods of treatment have been discussed with the patient and family. After consideration of risks, benefits and other options for treatment, the patient has consented to  Procedure(s) (LRB) with comments: HERNIA REPAIR INGUINAL ADULT (Right) as a surgical intervention .  The patient's history has been reviewed, patient examined, no change in status, stable for surgery.  I have reviewed the patient's chart and labs.  Questions were answered to the patient's satisfaction.     Brysan Mcevoy B

## 2012-11-01 MED ORDER — HYDROCODONE-ACETAMINOPHEN 5-325 MG PO TABS: 1.0000 | ORAL_TABLET | ORAL | Status: DC | PRN

## 2012-11-01 NOTE — Progress Notes (Signed)
1 Day Post-Op  Subjective: Sore.  Voiding. Walked last night.  Objective: Vital signs in last 24 hours: Temp:  [97.6 F (36.4 C)-99.8 F (37.7 C)] 98.6 F (37 C) (11/09 0553) Pulse Rate:  [59-70] 64  (11/09 0553) Resp:  [11-18] 18  (11/09 0553) BP: (95-152)/(54-80) 96/58 mmHg (11/09 0553) SpO2:  [95 %-100 %] 95 % (11/09 0553) Weight:  [128 lb (58.06 kg)] 128 lb (58.06 kg) (11/08 1900) Last BM Date: 10/30/12  Intake/Output from previous day: 11/08 0701 - 11/09 0700 In: 1367.5 [I.V.:1367.5] Out: 705 [Urine:700; Blood:5] Intake/Output this shift: Total I/O In: -  Out: 100 [Urine:100]  PE: General- In NAD  Right groin incision clean and intact with some swelling.  Lab Results:   Basename 10/31/12 1443  WBC 6.7  HGB 13.2  HCT 39.5  PLT 163   BMET  Basename 10/31/12 1443  NA --  K --  CL --  CO2 --  GLUCOSE --  BUN --  CREATININE 1.20  CALCIUM --   PT/INR No results found for this basename: LABPROT:2,INR:2 in the last 72 hours Comprehensive Metabolic Panel:    Component Value Date/Time   NA 138 10/27/2012 1420   K 4.9 10/27/2012 1420   CL 100 10/27/2012 1420   CO2 31 10/27/2012 1420   BUN 19 10/27/2012 1420   CREATININE 1.20 10/31/2012 1443   GLUCOSE 92 10/27/2012 1420   CALCIUM 9.8 10/27/2012 1420   AST 20 06/22/2012 1245   ALT 7 06/22/2012 1245   ALKPHOS 64 06/22/2012 1245   BILITOT 0.3 06/22/2012 1245   PROT 6.1 06/22/2012 1245   ALBUMIN 3.4* 06/22/2012 1245     Studies/Results: No results found.  Anti-infectives: Anti-infectives     Start     Dose/Rate Route Frequency Ordered Stop   10/31/12 0924   ciprofloxacin (CIPRO) IVPB 400 mg        400 mg 200 mL/hr over 60 Minutes Intravenous On call to O.R. 10/31/12 0924 10/31/12 1234          Assessment Active Problems:  s/p repair of recurrent RIH with mesh-doing fairly well.    LOS: 1 day   Plan: Discharge.   Gizelle Whetsel J 11/01/2012

## 2012-11-01 NOTE — Discharge Summary (Signed)
Physician Discharge Summary  Patient ID: Trevor Brooks MRN: [id number] DOB/AGE: Apr 10, 1922 78 y.o.  Admit date: 10/31/2012 Discharge date: 11/01/2012  Admission Diagnoses:  Recurrent right inguinal hernia  Discharge Diagnoses:  Same   Discharged Condition: good  Hospital Course: He underwent the procedure and tolerated it well.  He was voiding, ambulatory and the wound looked good.  He was ready for discharge.  Instructions were given.  Consults: None  Significant Diagnostic Studies: none   Treatments: surgery: Repair of recurrent RIH with mesh 11/8.  Discharge Exam: Blood pressure 96/58, pulse 64, temperature 98.6 F (37 C), temperature source Oral, resp. rate 18, height 5\' 10"  (1.778 m), weight 128 lb (58.06 kg), SpO2 95.00%.   Disposition: 01-Home or Self Care  Discharge Orders    Future Appointments: Provider: Department: Dept Phone: Center:   11/13/2012 12:00 PM Valarie Merino, MD [facility], [place] [phone] None       Medication List     As of 11/01/2012  9:16 AM    TAKE these medications         HYDROcodone-acetaminophen 5-325 MG per tablet   Commonly known as: NORCO/VICODIN   Take 1-2 tablets by mouth every 4 (four) hours as needed.      metoprolol tartrate 25 MG tablet   Commonly known as: LOPRESSOR   Take 12.5 mg by mouth 2 (two) times daily.         Signed: Adolph Pollack 11/01/2012, 9:16 AM

## 2012-11-01 NOTE — Progress Notes (Signed)
Bp 98/58. P=60. Due for lopressor 12.5 at 10:00. Dr Abbey Chatters consulted-med held for today. Pt, wife and daughter  informed to hold today.( pt for discharge).

## 2012-11-03 ENCOUNTER — Encounter (HOSPITAL_COMMUNITY): Payer: Self-pay | Admitting: Surgery

## 2012-11-13 ENCOUNTER — Ambulatory Visit (INDEPENDENT_AMBULATORY_CARE_PROVIDER_SITE_OTHER): Payer: Medicare Other | Admitting: Surgery

## 2012-11-13 VITALS — BP 122/72 | HR 60 | Temp 97.0°F | Resp 18 | Ht 70.0 in | Wt 129.0 lb

## 2012-11-13 DIAGNOSIS — Z8719 Personal history of other diseases of the digestive system: Secondary | ICD-10-CM

## 2012-11-13 DIAGNOSIS — Z9889 Other specified postprocedural states: Secondary | ICD-10-CM

## 2012-11-13 NOTE — Patient Instructions (Signed)
Thanks for your patience.  If you need further assistance after leaving the office, please call our office and speak with a [facility] nurse.  [phone].  If you want to leave a message for Dr. Jendayi Berling, please call his office phone at [phone]. 

## 2012-11-13 NOTE — Progress Notes (Signed)
Trevor Brooks 78 y.o.  Body mass index is 18.51 kg/(m^2).  Patient Active Problem List  Diagnosis  . VENTRICULAR HYPERTROPHY, LEFT  . HYPOTENSION  . COPD  . ANKLE EDEMA  . CHEST PAIN  . Sinoatrial node dysfunction  . atrial fib/flutter  rapid rates  . Syncope  . Pacemaker-Medtronic  . open RIH with mesh Oct 2013    Allergies  Allergen Reactions  . Amoxicillin-Pot Clavulanate Anaphylaxis  . Penicillins Anaphylaxis  . Vitamin K And Related     Past Surgical History  Procedure Date  . Bur hole evacuation 2012  . Cardiac catheterization 1996  . Pacemaker insertion 06-30-12  . Hernia repair     bilateral inguinal  . Partial gastrectomy   . Eye surgery     cataract extraction with IOL  . Brain surgery   . Inguinal hernia repair 10/31/2012    Procedure: HERNIA REPAIR INGUINAL ADULT;  Surgeon: Valarie Merino, MD;  Location: [facility];  Service: General;  Laterality: Right;  with mesh   Garlan Fillers, MD 1. S/P inguinal hernia repair using synthetic patch     Doing well after open right inguinal hernia repair with mesh. I would be happy to see him again whenever needed. Incision seems to be healing fine and repair intact. Return when necessary Matt B. Daphine Deutscher, MD, [facility] [phone] beeper [phone]  11/13/2012 12:58 PM

## 2013-05-11 ENCOUNTER — Ambulatory Visit (INDEPENDENT_AMBULATORY_CARE_PROVIDER_SITE_OTHER): Payer: Medicare Other | Admitting: Cardiology

## 2013-05-11 ENCOUNTER — Encounter: Payer: Self-pay | Admitting: Cardiology

## 2013-05-11 VITALS — BP 126/64 | HR 60 | Ht 69.0 in | Wt 131.0 lb

## 2013-05-11 DIAGNOSIS — I4892 Unspecified atrial flutter: Secondary | ICD-10-CM

## 2013-05-11 DIAGNOSIS — I495 Sick sinus syndrome: Secondary | ICD-10-CM

## 2013-05-11 NOTE — Patient Instructions (Addendum)
Your physician wants you to follow-up in: 1 year with Dr McLean. (May 2015).  You will receive a reminder letter in the mail two months in advance. If you don't receive a letter, please call our office to schedule the follow-up appointment.  

## 2013-05-13 DIAGNOSIS — I4892 Unspecified atrial flutter: Secondary | ICD-10-CM | POA: Insufficient documentation
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
## 2013-07-14 ENCOUNTER — Ambulatory Visit (INDEPENDENT_AMBULATORY_CARE_PROVIDER_SITE_OTHER): Payer: Medicare Other | Admitting: Internal Medicine

## 2013-07-14 ENCOUNTER — Encounter: Payer: Self-pay | Admitting: Internal Medicine

## 2013-07-14 VITALS — BP 119/76 | HR 97 | Ht 70.0 in | Wt 136.0 lb

## 2013-07-14 DIAGNOSIS — I4892 Unspecified atrial flutter: Secondary | ICD-10-CM

## 2013-07-14 DIAGNOSIS — Z950 Presence of cardiac pacemaker: Secondary | ICD-10-CM

## 2013-07-14 LAB — PACEMAKER DEVICE OBSERVATION
AL IMPEDENCE PM: 508 Ohm
ATRIAL PACING PM: 25
BATTERY VOLTAGE: 2.79 V
BRDY-0004RV: 120 {beats}/min
VENTRICULAR PACING PM: 0

## 2013-07-14 NOTE — Assessment & Plan Note (Signed)
25% of the time time   Not on anticoagulaiton with hx of SDHematoma  chads vasx score only 2

## 2013-07-14 NOTE — Patient Instructions (Signed)
Your physician wants you to follow-up in: 1 year with the device clinic. You will receive a reminder letter in the mail two months in advance. If you don't receive a letter, please call our office to schedule the follow-up appointment.  Your physician recommends that you continue on your current medications as directed. Please refer to the Current Medication list given to you today.
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
## 2013-07-15 NOTE — Assessment & Plan Note (Signed)
The patient's device was interrogated.  The information was reviewed. No changes were made in the programming.    

## 2013-07-20 ENCOUNTER — Encounter: Payer: Self-pay | Admitting: Internal Medicine

## 2013-09-15 ENCOUNTER — Other Ambulatory Visit: Payer: Self-pay | Admitting: Cardiology
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
## 2014-04-14 HISTORY — PX: COLONOSCOPY: SHX174

## 2014-05-25 ENCOUNTER — Encounter: Payer: Self-pay | Admitting: *Deleted

## 2014-07-15 ENCOUNTER — Ambulatory Visit (INDEPENDENT_AMBULATORY_CARE_PROVIDER_SITE_OTHER): Payer: Medicare Other | Admitting: *Deleted

## 2014-07-15 DIAGNOSIS — I4892 Unspecified atrial flutter: Secondary | ICD-10-CM

## 2014-07-15 DIAGNOSIS — I495 Sick sinus syndrome: Secondary | ICD-10-CM

## 2014-07-15 NOTE — Progress Notes (Signed)
Pacemaker check in clinic. Normal device function. Threshold, sensing, impedances consistent with previous measurements. Device programmed to maximize longevity. [id number] AHRs (53.1%)---persistant since >=12-2013---h/o bleed unable to A/C. PVAB decreased to 150ms to avoid undersensing. ?VVIR next appt. No high ventricular rates noted. Device programmed at appropriate safety margins. Histogram distribution appropriate for patient activity level. Device programmed to optimize intrinsic conduction. Estimated longevity 10 years. Patient will follow up with SK in 6 months.

## 2014-08-19 ENCOUNTER — Encounter: Payer: Self-pay | Admitting: Internal Medicine

## 2014-09-07 LAB — MDC_IDC_ENUM_SESS_TYPE_INCLINIC
Battery Remaining Longevity: 10
Battery Voltage: 2.8 V
Brady Statistic AS VP Percent: 4.4 %
Lead Channel Sensing Intrinsic Amplitude: 4 mV
Lead Channel Setting Pacing Amplitude: 2.5 V
Lead Channel Setting Sensing Sensitivity: 2 mV
MDC IDC MSMT BATTERY IMPEDANCE: 159 Ohm
MDC IDC MSMT LEADCHNL RA IMPEDANCE VALUE: 515 Ohm
MDC IDC MSMT LEADCHNL RA SENSING INTR AMPL: 1.4 mV
MDC IDC MSMT LEADCHNL RV IMPEDANCE VALUE: 483 Ohm
MDC IDC MSMT LEADCHNL RV PACING THRESHOLD AMPLITUDE: 0.75 V
MDC IDC MSMT LEADCHNL RV PACING THRESHOLD PULSEWIDTH: 0.4 ms
MDC IDC SET LEADCHNL RA PACING AMPLITUDE: 2 V
MDC IDC SET LEADCHNL RV PACING PULSEWIDTH: 0.4 ms
MDC IDC STAT BRADY AP VP PERCENT: 23.3 %
MDC IDC STAT BRADY AP VS PERCENT: 9.9 %
MDC IDC STAT BRADY AS VS PERCENT: 62.4 %

## 2014-09-09 ENCOUNTER — Inpatient Hospital Stay (HOSPITAL_COMMUNITY): Payer: Medicare Other

## 2014-09-09 ENCOUNTER — Inpatient Hospital Stay (HOSPITAL_COMMUNITY)
Admission: AD | Admit: 2014-09-09 | Discharge: 2014-09-10 | DRG: 092 | Disposition: A | Discharge status: Home Health | Payer: Medicare Other | Source: Ambulatory Visit | Attending: Internal Medicine | Admitting: Internal Medicine

## 2014-09-09 ENCOUNTER — Encounter (HOSPITAL_COMMUNITY): Payer: Self-pay

## 2014-09-09 DIAGNOSIS — H9190 Unspecified hearing loss, unspecified ear: Secondary | ICD-10-CM | POA: Diagnosis present

## 2014-09-09 DIAGNOSIS — I517 Cardiomegaly: Secondary | ICD-10-CM

## 2014-09-09 DIAGNOSIS — I483 Typical atrial flutter: Secondary | ICD-10-CM

## 2014-09-09 DIAGNOSIS — Z9181 History of falling: Secondary | ICD-10-CM | POA: Diagnosis not present

## 2014-09-09 DIAGNOSIS — H9193 Unspecified hearing loss, bilateral: Secondary | ICD-10-CM | POA: Insufficient documentation

## 2014-09-09 DIAGNOSIS — J4489 Other specified chronic obstructive pulmonary disease: Secondary | ICD-10-CM | POA: Diagnosis present

## 2014-09-09 DIAGNOSIS — G2581 Restless legs syndrome: Secondary | ICD-10-CM | POA: Diagnosis present

## 2014-09-09 DIAGNOSIS — Z9889 Other specified postprocedural states: Secondary | ICD-10-CM

## 2014-09-09 DIAGNOSIS — I4892 Unspecified atrial flutter: Secondary | ICD-10-CM | POA: Diagnosis present

## 2014-09-09 DIAGNOSIS — I4891 Unspecified atrial fibrillation: Secondary | ICD-10-CM | POA: Diagnosis present

## 2014-09-09 DIAGNOSIS — J449 Chronic obstructive pulmonary disease, unspecified: Secondary | ICD-10-CM

## 2014-09-09 DIAGNOSIS — Z961 Presence of intraocular lens: Secondary | ICD-10-CM | POA: Diagnosis not present

## 2014-09-09 DIAGNOSIS — M25559 Pain in unspecified hip: Secondary | ICD-10-CM | POA: Diagnosis present

## 2014-09-09 DIAGNOSIS — I959 Hypotension, unspecified: Secondary | ICD-10-CM

## 2014-09-09 DIAGNOSIS — Z79899 Other long term (current) drug therapy: Secondary | ICD-10-CM

## 2014-09-09 DIAGNOSIS — F17200 Nicotine dependence, unspecified, uncomplicated: Secondary | ICD-10-CM | POA: Diagnosis present

## 2014-09-09 DIAGNOSIS — R269 Unspecified abnormalities of gait and mobility: Principal | ICD-10-CM | POA: Diagnosis present

## 2014-09-09 DIAGNOSIS — Z950 Presence of cardiac pacemaker: Secondary | ICD-10-CM

## 2014-09-09 DIAGNOSIS — E785 Hyperlipidemia, unspecified: Secondary | ICD-10-CM | POA: Diagnosis present

## 2014-09-09 DIAGNOSIS — M25552 Pain in left hip: Secondary | ICD-10-CM | POA: Diagnosis present

## 2014-09-09 DIAGNOSIS — Z9849 Cataract extraction status, unspecified eye: Secondary | ICD-10-CM

## 2014-09-09 DIAGNOSIS — R079 Chest pain, unspecified: Secondary | ICD-10-CM

## 2014-09-09 DIAGNOSIS — R2681 Unsteadiness on feet: Secondary | ICD-10-CM | POA: Diagnosis present

## 2014-09-09 DIAGNOSIS — I495 Sick sinus syndrome: Secondary | ICD-10-CM

## 2014-09-09 DIAGNOSIS — Z8719 Personal history of other diseases of the digestive system: Secondary | ICD-10-CM

## 2014-09-09 DIAGNOSIS — R609 Edema, unspecified: Secondary | ICD-10-CM

## 2014-09-09 HISTORY — DX: Hyperlipidemia, unspecified: E78.5

## 2014-09-09 HISTORY — DX: Diverticulosis of intestine, part unspecified, without perforation or abscess without bleeding: K57.90

## 2014-09-09 LAB — CBC
HCT: 39.3 % (ref 39.0–52.0)
Hemoglobin: 12.5 g/dL — ABNORMAL LOW (ref 13.0–17.0)
MCH: 28.3 pg (ref 26.0–34.0)
MCHC: 31.8 g/dL (ref 30.0–36.0)
MCV: 89.1 fL (ref 78.0–100.0)
PLATELETS: 169 10*3/uL (ref 150–400)
RBC: 4.41 MIL/uL (ref 4.22–5.81)
RDW: 14.4 % (ref 11.5–15.5)
WBC: 6.6 10*3/uL (ref 4.0–10.5)

## 2014-09-09 LAB — COMPREHENSIVE METABOLIC PANEL
ALK PHOS: 66 U/L (ref 39–117)
ALT: 9 U/L (ref 0–53)
AST: 19 U/L (ref 0–37)
Albumin: 3.6 g/dL (ref 3.5–5.2)
Anion gap: 10 (ref 5–15)
BILIRUBIN TOTAL: 0.4 mg/dL (ref 0.3–1.2)
BUN: 22 mg/dL (ref 6–23)
CHLORIDE: 101 meq/L (ref 96–112)
CO2: 29 mEq/L (ref 19–32)
CREATININE: 1.26 mg/dL (ref 0.50–1.35)
Calcium: 9.2 mg/dL (ref 8.4–10.5)
GFR calc Af Amer: 56 mL/min — ABNORMAL LOW (ref >90)
GFR, EST NON AFRICAN AMERICAN: 48 mL/min — AB (ref >90)
Glucose, Bld: 160 mg/dL — ABNORMAL HIGH (ref 70–99)
POTASSIUM: 5.1 meq/L (ref 3.7–5.3)
Sodium: 140 mEq/L (ref 137–147)
Total Protein: 6.5 g/dL (ref 6.0–8.3)

## 2014-09-09 MED ORDER — TRAMADOL HCL 50 MG PO TABS: 50.0000 mg | ORAL_TABLET | Freq: Four times a day (QID) | ORAL | Status: DC | PRN

## 2014-09-09 MED ORDER — ACETAMINOPHEN 650 MG RE SUPP: 650.0000 mg | Freq: Four times a day (QID) | RECTAL | Status: DC | PRN

## 2014-09-09 MED ORDER — POTASSIUM CHLORIDE IN NACL 20-0.9 MEQ/L-% IV SOLN
INTRAVENOUS | Status: DC
  Administered 2014-09-09: 16:00:00 via INTRAVENOUS
  Filled 2014-09-09 (×2): qty 1000

## 2014-09-09 MED ORDER — ACETAMINOPHEN 325 MG PO TABS
650.0000 mg | ORAL_TABLET | Freq: Four times a day (QID) | ORAL | Status: DC | PRN
  Administered 2014-09-09 – 2014-09-10 (×2): 650 mg via ORAL
  Filled 2014-09-09 (×2): qty 2

## 2014-09-09 MED ORDER — SENNOSIDES-DOCUSATE SODIUM 8.6-50 MG PO TABS: 1.0000 | ORAL_TABLET | Freq: Every evening | ORAL | Status: DC | PRN

## 2014-09-09 MED ORDER — ALUM & MAG HYDROXIDE-SIMETH 200-200-20 MG/5ML PO SUSP: 30.0000 mL | Freq: Four times a day (QID) | ORAL | Status: DC | PRN

## 2014-09-09 MED ORDER — PROMETHAZINE HCL 25 MG PO TABS
12.5000 mg | ORAL_TABLET | Freq: Four times a day (QID) | ORAL | Status: DC | PRN
Start: 2014-09-09 — End: 2014-09-10

## 2014-09-09 MED ORDER — ZOLPIDEM TARTRATE 5 MG PO TABS
5.0000 mg | ORAL_TABLET | Freq: Every evening | ORAL | Status: DC | PRN
Start: 2014-09-09 — End: 2014-09-10

## 2014-09-09 MED ORDER — IOHEXOL 300 MG/ML  SOLN
80.0000 mL | Freq: Once | INTRAMUSCULAR | Status: AC | PRN
  Administered 2014-09-09: 80 mL via INTRAVENOUS

## 2014-09-09 MED ORDER — FLEET ENEMA 7-19 GM/118ML RE ENEM
1.0000 | ENEMA | Freq: Once | RECTAL | Status: AC | PRN
Start: 2014-09-09 — End: 2014-09-09

## 2014-09-09 MED ORDER — ALBUTEROL SULFATE (2.5 MG/3ML) 0.083% IN NEBU: 2.5000 mg | INHALATION_SOLUTION | Freq: Four times a day (QID) | RESPIRATORY_TRACT | Status: DC | PRN

## 2014-09-09 MED ORDER — ENOXAPARIN SODIUM 40 MG/0.4ML ~~LOC~~ SOLN
40.0000 mg | SUBCUTANEOUS | Status: DC
  Administered 2014-09-09: 40 mg via SUBCUTANEOUS
  Filled 2014-09-09 (×2): qty 0.4

## 2014-09-09 MED ORDER — ADULT MULTIVITAMIN W/MINERALS CH
1.0000 | ORAL_TABLET | Freq: Every day | ORAL | Status: DC
  Administered 2014-09-09 – 2014-09-10 (×2): 1 via ORAL
  Filled 2014-09-09 (×2): qty 1
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
## 2014-09-10 MED ORDER — PREDNISONE 20 MG PO TABS
40.0000 mg | ORAL_TABLET | Freq: Every day | ORAL | Status: DC
  Administered 2014-09-10: 40 mg via ORAL
  Filled 2014-09-10 (×2): qty 2

## 2014-09-10 MED ORDER — ACETAMINOPHEN 325 MG PO TABS: 650.0000 mg | ORAL_TABLET | Freq: Four times a day (QID) | ORAL | Status: AC | PRN

## 2014-09-10 NOTE — Progress Notes (Signed)
[facility]  [facility] is providing the following services: RW w/seat  If patient discharges after hours, please call [phone].   Renard Hamper 09/10/2014, 9:26 AM

## 2014-09-10 NOTE — Discharge Summary (Signed)
Physician Discharge Summary  Patient ID: Trevor Brooks MRN: [id number] DOB/AGE: July 27, 1922 80 y.o.  Admit date: 09/09/2014 Discharge date: 09/10/2014   Discharge Diagnoses:  Principal Problem:   Gait instability Active Problems:   COPD   Left hip pain   Discharged Condition: fair  Hospital Course: The patient is a 80 year old Caucasian man with several medical problems who a few weeks ago presented to our office with cough and fever and was started on Avelox and Zithromax for community-acquired pneumonia. He was also treated with low-dose prednisone. His symptoms have improved but not totally resolved. He has been eating and drinking well. This week he said several falls and is had worsening left hip pain with any weightbearing. He has not had any problems with headaches, nausea, vomiting, fever, chills, or diarrhea. He has a long history of cigarette abuse is ongoing and a half pack per day and has a history of emphysema. In the past he had a fall leading to subdural hematoma requiring surgery.  He had multiple imaging studies while in the hospital which included a head CT scan without contrast that showed no acute abnormalities, x-rays of the lumbar spine and left hip that showed some compression fractures in the lumbar spine of unclear duration, and no evidence of fracture on the hip x-rays, and a CT scan of the hip that again showed no evidence of a fracture. He also had a CT scan of the chest with IV contrast because of persistent air space disease in his left lung after treatment with antibiotics and an ongoing history of tobacco abuse.  The CT chest showed minimal left base patchy airspace disease. He had a physical therapist evaluation of his gait during which she was able to ambulate 100 feet using a walker.  He had some pain in the left thigh and hip that was decreased with using his arms for weightbearing, and he needed verbal cues for hand placement and use of the rolling walker.  He  was felt to need assistance with all ambulation because of increased fall risk.it is recommended that he continue outpatient physical therapy at least 3 times per week to improve his ability to ambulate.  There were no competitions from his hospitalization.   Consults: None  Significant Diagnostic Studies:  Dg Lumbar Spine 2-3 Views  09/09/2014   CLINICAL DATA:  Pain in the low back status post multiple falls.  EXAM: LUMBAR SPINE - 2-3 VIEW  COMPARISON:  None.  FINDINGS: Normal anatomic alignment. Multilevel degenerative disc disease of the lumbar spine most pronounced at L4-5 and L5-S1. L5-S1 facet degenerative change. Age-indeterminate anterior height loss of the T12 and L1 vertebral bodies. Aortic vascular calcifications. SI joints unremarkable. Contrast material within the bilateral renal collecting systems. Surgical clips within the upper abdomen.  IMPRESSION: Age-indeterminate wedge compression deformities of the T12 and L1 vertebral bodies. Recommend correlation with patient's site of tenderness.  Multilevel degenerative disc disease.   Electronically Signed   By: Annia Belt M.D.   On: 09/09/2014 18:18   Dg Hip Complete Left  09/09/2014   CLINICAL DATA:  Low back pain radiating down the left hip.  EXAM: LEFT HIP - COMPLETE 2+ VIEW  COMPARISON:  None.  FINDINGS: Lower lumbar spine degenerative change. Normal anatomic alignment of the bilateral hip joints. No evidence for acute displaced fracture of the left hip. Stool throughout the visualized colon.  IMPRESSION: No evidence for displaced fracture of the visualize left hip.  If there is high clinical suspicion  for occult fracture or the patient refuses to weightbear, consider further evaluation with MRI. Although CT is expeditious, evidence is lacking regarding accuracy of CT over plain film radiography.   Electronically Signed   By: Annia Belt M.D.   On: 09/09/2014 18:21   Ct Head Wo Contrast  09/09/2014   CLINICAL DATA:  Multiple falls.   History of subdural hematoma.  EXAM: CT HEAD WITHOUT CONTRAST  TECHNIQUE: Contiguous axial images were obtained from the base of the skull through the vertex without intravenous contrast.  COMPARISON:  Brain CT 06/22/2012  FINDINGS: Ventricles and sulci are prominent compatible with atrophy. Small bilateral low attenuation extra-axial fluid collections are re- demonstrated overlying the frontal lobes. No evidence for acute cortically based infarct, mass lesion or significant mass effect. Re- demonstrated bilateral frontal and parietal burr holes. Paranasal sinuses are unremarkable. Mastoid air cells are well aerated.  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  Cortical atrophy and chronic small vessel ischemic change.   Electronically Signed   By: Annia Belt M.D.   On: 09/09/2014 18:12   Ct Chest W Contrast  09/09/2014   CLINICAL DATA:  Non-resolving left lower lobe airspace disease. History fall.  EXAM: CT CHEST WITH CONTRAST  TECHNIQUE: Multidetector CT imaging of the chest was performed during intravenous contrast administration.  CONTRAST:  80mL OMNIPAQUE IOHEXOL 300 MG/ML  SOLN  COMPARISON:  Chest radiograph 07/01/2012.  FINDINGS: No enlarged axillary, mediastinal or hilar lymphadenopathy. Normal heart size. Aorta and main pulmonary artery are normal in caliber. There is scattered calcified atherosclerotic plaque involving the thoracic aorta.  Small amount of dependent mucus within the distal trachea at the level of the carina. Multiple irregular consolidative opacities are demonstrated within the left lower lobe. More dominant opacity within the subpleural left lower lobe measures 2.6 x 1.9 cm (image 52; series 5). There is a 3 mm nodule within the superior segment left lower lobe (image 38; series 5). Adjacent 3 mm nodule within the superior segment left lower lobe (image 39; series 5). Multiple  adjacent irregular opacities are demonstrated within the right lower lobe with the dominant opacity measuring 1.7 cm. There is adjacent tree-in-bud nodularity. Additional small nodules are demonstrated within the right lower lobe. Mild bronchial wall thickening within the right middle lobe. No pleural effusion or pneumothorax.  Visualization of the upper abdomen demonstrates no focal abnormality. Surgical clips at the GE junction. Re- demonstrated age-indeterminate wedge compression deformity of the T12 and L1 vertebral bodies. No aggressive appearing osseous lesions.  IMPRESSION: Multiple adjacent irregular consolidative opacities within the left lower lobe. Additionally irregular consolidative opacities with some adjacent tree-in-bud nodularity is demonstrated within the right lower lobe. Findings are nonspecific and may be secondary to multi-focal pneumonia or aspiration. Malignant process is not excluded. Short-term followup chest CT in 4-6 weeks after treatment is recommended to evaluate for interval change.  Small amount of dependent fluid within the distal trachea, potentially secondary to aspiration.   Electronically Signed   By: Annia Belt M.D.   On: 09/09/2014 18:34   Ct Hip Left Wo Contrast  09/10/2014   CLINICAL DATA:  Fall.  LEFT hip pain.  EXAM: CT OF THE LEFT HIP WITHOUT CONTRAST  TECHNIQUE: Multidetector CT imaging of the left hip was performed according to the standard protocol. Multiplanar CT image reconstructions were also generated.  COMPARISON:  None.  FINDINGS: Partial visualization of the visceral pelvis demonstrates prostatomegaly and a trabeculated  urinary bladder, compatible with bladder outflow obstruction. There is no hip effusion. No displaced hip fracture is present. The LEFT obturator ring appears normal. Proximal LEFT femoral shaft is normal. Mild LEFT hip osteoarthritis is present with chondrocalcinosis. Atherosclerosis is present.  IMPRESSION: 1. Negative for acute osseous  abnormality. 2. Findings compatible with chronic bladder outflow obstruction. 3. Mild LEFT hip osteoarthritis.   Electronically Signed   By: Andreas Newport M.D.   On: 09/10/2014 07:50    Labs: Lab Results  Component Value Date   WBC 6.6 09/09/2014   HGB 12.5* 09/09/2014   HCT 39.3 09/09/2014   MCV 89.1 09/09/2014   PLT 169 09/09/2014     Recent Labs Lab 09/09/14 1548  NA 140  K 5.1  CL 101  CO2 29  BUN 22  CREATININE 1.26  CALCIUM 9.2  PROT 6.5  BILITOT 0.4  ALKPHOS 66  ALT 9  AST 19  GLUCOSE 160*       Lab Results  Component Value Date   INR 1.08 04/07/2012   INR 1.22 05/21/2011   INR 1.20 05/21/2011     No results found for this or any previous visit (from the past 240 hour(s)).    Discharge Exam: Blood pressure 115/75, pulse 66, temperature 98.2 F (36.8 C), temperature source Oral, resp. rate 16, height  (1.727 m), weight 57.153 kg (126 lb), SpO2 100.00%.  Physical Exam: In general, the patient is an elderly white man who was in no apparent distress while lying supine in bed.  He has moderate short-term memory deficits and severe limited hearing.  HEENT exam was within normal limits, neck was supple without jugular venous distention, chest was clear to auscultation with hyperexpanded lungs, heart had a regular rate and rhythm, abdomen had normal bowel sounds and no tenderness, extremities were without cyanosis, clubbing, or edema and there is mild pain with internal and external rotation of the left hip.  He was alert and answered questions appropriately and could move all extremities.  Disposition:  He'll be discharged to home in the company of family members.  It was recommended that he have 24-hour availability of assistance to ambulate safely.  We will have him use a light weight walker with wheels to help decrease pain in the left hip and improve his ability to safely ambulate.  We will set up outpatient physical therapy for him within the next  week.  Discharge Instructions   Call MD for:    Complete by:  As directed   Call for fever, chills, worsened difficulty breathing, chest pain, worsening ability to walk, worsened left hip pain, or any other concerning symptoms     Diet - low sodium heart healthy    Complete by:  As directed      Discharge instructions    Complete by:  As directed   Use lightweight walker with wheels (we will have script available for this in the office) to prevent falls. We will set up home health physical therapy for you.     Increase activity slowly    Complete by:  As directed   Have somebody with you at home at all times to prevent falls with injury            Medication List         acetaminophen 325 MG tablet  Commonly known as:  TYLENOL  Take 2 tablets (650 mg total) by mouth every 6 (six) hours as needed for mild pain (or Fever >/=  101).     ibuprofen 200 MG tablet  Commonly known as:  ADVIL,MOTRIN  Take 200 mg by mouth 2 (two) times daily.     metoprolol tartrate 25 MG tablet  Commonly known as:  LOPRESSOR  Take 12.5 mg by mouth 2 (two) times daily.           Follow-up Information   Follow up with Garlan Fillers, MD. Schedule an appointment as soon as possible for a visit in 1 week.   Specialty:  Internal Medicine   Contact information:   [address] [place] [address] [phone]       Signed: Garlan Fillers 09/10/2014, 1:41 PM

## 2014-09-10 NOTE — Evaluation (Signed)
Physical Therapy Evaluation Patient Details Name: Trevor Brooks MRN: [id number] DOB: 1922/02/24 Today's Date: 09/10/2014   History of Present Illness  Pt is a 80 year old male admitted after fall at home with L hip pain (CT negative for acute findings) with hx of COPD, arthritis, afib, and hx of falls with one resulting in subdural hematoma requiring surgery.  Clinical Impression  Pt currently with functional limitations due to the deficits listed below (see PT Problem List).  Pt will benefit from skilled PT to increase their independence and safety with mobility to allow discharge to the venue listed below.  Pt educated on ambulating with RW however when assisted to bathroom prior to return to bed pt would leave RW behind requiring cues for safety.  Family member in room had daughter on the phone, so discussed fall risk and concern for safety, recommending RW, HHPT and 24/7 supervision with daughter over phone (daughter agreeable).  Order for walker with 4 wheels and seat seen in chart and would not recommend this for pt due to high fall risk.     Follow Up Recommendations Home health PT;Supervision/Assistance - 24 hour    Equipment Recommendations  Rolling walker with 5" wheels    Recommendations for Other Services       Precautions / Restrictions Precautions Precautions: Fall Restrictions Other Position/Activity Restrictions: no restrictions if L hip CT negative and CT was negative      Mobility  Bed Mobility Overal bed mobility: Modified Independent                Transfers Overall transfer level: Needs assistance Equipment used: Rolling walker (2 wheeled) Transfers: Sit to/from Stand Sit to Stand: Min guard         General transfer comment: verbal cues for hand placement and use of RW  Ambulation/Gait Ambulation/Gait assistance: Min guard Ambulation Distance (Feet): 100 Feet Assistive device: Rolling walker (2 wheeled) Gait Pattern/deviations:  Step-through pattern;Antalgic;Decreased stance time - left;Decreased step length - right     General Gait Details: pt reports increase in L thigh/hip pain during gait so instructed on WBing through UEs for pain control which pt reports helps, verbal cues for RW positioning and safe use of RW as pt typically does not ambulate with assistive device  Stairs            Wheelchair Mobility    Modified Rankin (Stroke Patients Only)       Balance                                             Pertinent Vitals/Pain Pain Assessment: Faces Faces Pain Scale: Hurts even more Pain Location: L thigh (pt points from knee to hip) Pain Descriptors / Indicators: Aching Pain Intervention(s): Monitored during session;Repositioned;Limited activity within patient's tolerance    Home Living Family/patient expects to be discharged to:: Private residence Living Arrangements: Spouse/significant other Available Help at Discharge: Family;Available 24 hours/day Type of Home: House       Home Layout: One level Home Equipment: Cane - single point      Prior Function Level of Independence: Independent               Hand Dominance        Extremity/Trunk Assessment               Lower Extremity Assessment: LLE deficits/detail  LLE Deficits / Details: full active ROM however pain with mobility     Communication   Communication: HOH  Cognition Arousal/Alertness: Awake/alert Behavior During Therapy: WFL for tasks assessed/performed Overall Cognitive Status: Within Functional Limits for tasks assessed                      General Comments      Exercises        Assessment/Plan    PT Assessment Patient needs continued PT services  PT Diagnosis Abnormality of gait;Acute pain   PT Problem List Decreased strength;Decreased balance;Decreased mobility;Pain;Decreased safety awareness;Decreased knowledge of use of DME  PT Treatment Interventions DME  instruction;Gait training;Functional mobility training;Therapeutic activities;Therapeutic exercise;Patient/family education;Balance training   PT Goals (Current goals can be found in the Care Plan section) Acute Rehab PT Goals PT Goal Formulation: With patient/family Time For Goal Achievement: 09/17/14 Potential to Achieve Goals: Good    Frequency Min 3X/week   Barriers to discharge        Co-evaluation               End of Session Equipment Utilized During Treatment: Gait belt Activity Tolerance: Patient tolerated treatment well Patient left: in bed;with call bell/phone within reach;with bed alarm set           Time: 1207-1227 PT Time Calculation (min): 20 min   Charges:   PT Evaluation $Initial PT Evaluation Tier I: 1 Procedure PT Treatments $Gait Training: 8-22 mins   PT G Codes:          Delphine Sizemore,KATHrine E 09/10/2014, 1:00 PM Zenovia Jarred, PT, DPT 09/10/2014 Pager: [phone]

## 2014-09-10 NOTE — Care Management Note (Signed)
CARE MANAGEMENT NOTE 09/10/2014  Patient:  Trevor Brooks, Trevor Brooks   Account Number:  [id number]  Date Initiated:  09/10/2014  Documentation initiated by:  Berenda Morale  Subjective/Objective Assessment:   Pt admitted with gait instability     Action/Plan:   from home with wife   Anticipated DC Date:  09/10/2014   Anticipated DC Plan:  HOME W HOME HEALTH SERVICES      DC Planning Services  CM consult      Choice offered to / List presented to:  C-1 Patient   DME arranged  Levan Hurst      DME agency  [facility]     HH arranged  HH-2 PT      [facility] agency  [facility]   Status of service:  Completed, signed off Medicare Important Message given?   (If response is "NO", the following Medicare IM given date fields will be blank) Date Medicare IM given:   Medicare IM given by:   Date Additional Medicare IM given:   Additional Medicare IM given by:    Discharge Disposition:    Per UR Regulation:  Reviewed for med. necessity/level of care/duration of stay  If discussed at Long Length of Stay Meetings, dates discussed:    Comments:  09/10/14 Sandford Craze RN,BSN,NCM [phone] PT is recommending HHPT and RW.  PT and wife offered choice.  Wife wanted to call daughter to ask her who to use.  THey have chosen [facility].  [facility] rep Malinda called to give referral.  Awaiting on MD orders, RN called MD to ask for orders.

## 2014-09-10 NOTE — Progress Notes (Signed)
Nursing Discharge Summary  Patient ID: Trevor Brooks MRN: [id number] DOB/AGE: 78-27-1923 78 y.o.  Admit date: 09/09/2014 Discharge date: 09/10/2014  Discharged Condition: good  Disposition: 01-Home or Self Care  Follow-up Information   Follow up with Garlan Fillers, MD. Schedule an appointment as soon as possible for a visit in 1 week.   Specialty:  Internal Medicine   Contact information:   [address] [place] [address] [phone]       Prescriptions Given: No prescriptions given. Patient's wife and daughter in law verbalized understanding of discharge instructions.   Means of Discharge: Patient to be taken downstairs via wheelchair to be discharged home.   Signed: Gloriajean Dell 09/10/2014, 3:49 PM

## 2014-09-20 ENCOUNTER — Other Ambulatory Visit: Payer: Self-pay | Admitting: Internal Medicine

## 2014-09-20 DIAGNOSIS — M545 Low back pain, unspecified: Secondary | ICD-10-CM

## 2014-09-20 DIAGNOSIS — M25552 Pain in left hip: Secondary | ICD-10-CM

## 2014-09-20 DIAGNOSIS — R9389 Abnormal findings on diagnostic imaging of other specified body structures: Secondary | ICD-10-CM

## 2014-09-28 ENCOUNTER — Ambulatory Visit
Admission: RE | Admit: 2014-09-28 | Discharge: 2014-09-28 | Disposition: A | Discharge status: Home / Self Care | Payer: Medicare Other | Source: Ambulatory Visit | Attending: Internal Medicine | Admitting: Internal Medicine

## 2014-09-28 DIAGNOSIS — M25552 Pain in left hip: Secondary | ICD-10-CM

## 2014-09-28 DIAGNOSIS — M545 Low back pain, unspecified: Secondary | ICD-10-CM

## 2014-10-08 ENCOUNTER — Other Ambulatory Visit: Payer: Self-pay | Admitting: *Deleted

## 2014-10-08 ENCOUNTER — Other Ambulatory Visit: Payer: Self-pay

## 2014-10-08 MED ORDER — METOPROLOL TARTRATE 25 MG PO TABS: 12.5000 mg | ORAL_TABLET | Freq: Two times a day (BID) | ORAL | Status: DC

## 2014-10-09 ENCOUNTER — Other Ambulatory Visit: Payer: Self-pay | Admitting: *Deleted

## 2014-10-21 ENCOUNTER — Ambulatory Visit
Admission: RE | Admit: 2014-10-21 | Discharge: 2014-10-21 | Disposition: A | Discharge status: Home / Self Care | Payer: Medicare Other | Source: Ambulatory Visit | Attending: Internal Medicine | Admitting: Internal Medicine

## 2014-10-21 DIAGNOSIS — R9389 Abnormal findings on diagnostic imaging of other specified body structures: Secondary | ICD-10-CM

## 2014-10-27 ENCOUNTER — Ambulatory Visit (INDEPENDENT_AMBULATORY_CARE_PROVIDER_SITE_OTHER): Payer: Medicare Other | Admitting: Physician Assistant

## 2014-10-27 ENCOUNTER — Encounter: Payer: Self-pay | Admitting: Physician Assistant

## 2014-10-27 VITALS — BP 118/72 | HR 66 | Ht 68.0 in | Wt 129.0 lb

## 2014-10-27 DIAGNOSIS — I484 Atypical atrial flutter: Secondary | ICD-10-CM

## 2014-10-27 DIAGNOSIS — Z950 Presence of cardiac pacemaker: Secondary | ICD-10-CM

## 2014-10-27 DIAGNOSIS — I495 Sick sinus syndrome: Secondary | ICD-10-CM

## 2014-10-27 MED ORDER — METOPROLOL TARTRATE 25 MG PO TABS: 12.5000 mg | ORAL_TABLET | Freq: Two times a day (BID) | ORAL | Status: DC

## 2014-10-27 NOTE — Patient Instructions (Addendum)
A refill on Metoprolol Tartrate has been sent to your pharmacy [facility]   Schedule a follow up visit with Dr. Sherryl MangesSteven Klein in 6 months.  Schedule a follow up visit with Dr. Marca Anconaalton McLean in 1 year.
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
## 2014-12-02 ENCOUNTER — Encounter (HOSPITAL_COMMUNITY): Payer: Self-pay | Admitting: Internal Medicine

## 2015-01-27 ENCOUNTER — Encounter: Payer: Self-pay | Admitting: *Deleted

## 2015-02-16 ENCOUNTER — Telehealth: Payer: Self-pay | Admitting: Internal Medicine

## 2015-02-16 NOTE — Telephone Encounter (Signed)
New Message  Pt daughter has question for device. Please call back and discuss.

## 2015-02-16 NOTE — Telephone Encounter (Signed)
Spoke to daughter about pt's new [employer] monitor. She states that the pt did not know what he was supposed to do with the equipment or when he was supposed to send a transmission. I told her that when they come into the office next month we will demonstrate to them how to use the monitor and we will also give them a date for when to transmit. Daughter voiced understanding.

## 2015-02-19 ENCOUNTER — Encounter (HOSPITAL_COMMUNITY): Payer: Self-pay | Admitting: Emergency Medicine

## 2015-02-19 ENCOUNTER — Emergency Department (HOSPITAL_COMMUNITY)
Admission: EM | Admit: 2015-02-19 | Discharge: 2015-02-19 | Disposition: A | Discharge status: Home / Self Care | Payer: Medicare HMO | Attending: Emergency Medicine | Admitting: Emergency Medicine

## 2015-02-19 ENCOUNTER — Emergency Department (HOSPITAL_COMMUNITY): Payer: Medicare HMO

## 2015-02-19 ENCOUNTER — Emergency Department (INDEPENDENT_AMBULATORY_CARE_PROVIDER_SITE_OTHER)
Admission: EM | Admit: 2015-02-19 | Discharge: 2015-02-19 | Disposition: A | Discharge status: Nursing Home (Includes ICF) | Payer: Medicare HMO | Source: Home / Self Care | Attending: Emergency Medicine | Admitting: Emergency Medicine

## 2015-02-19 DIAGNOSIS — Y289XXA Contact with unspecified sharp object, undetermined intent, initial encounter: Secondary | ICD-10-CM | POA: Insufficient documentation

## 2015-02-19 DIAGNOSIS — S68121A Partial traumatic metacarpophalangeal amputation of left index finger, initial encounter: Secondary | ICD-10-CM | POA: Diagnosis not present

## 2015-02-19 DIAGNOSIS — Y9389 Activity, other specified: Secondary | ICD-10-CM | POA: Diagnosis not present

## 2015-02-19 DIAGNOSIS — Z791 Long term (current) use of non-steroidal anti-inflammatories (NSAID): Secondary | ICD-10-CM | POA: Diagnosis not present

## 2015-02-19 DIAGNOSIS — E785 Hyperlipidemia, unspecified: Secondary | ICD-10-CM | POA: Insufficient documentation

## 2015-02-19 DIAGNOSIS — Y998 Other external cause status: Secondary | ICD-10-CM | POA: Insufficient documentation

## 2015-02-19 DIAGNOSIS — Z720 Tobacco use: Secondary | ICD-10-CM | POA: Insufficient documentation

## 2015-02-19 DIAGNOSIS — Z79899 Other long term (current) drug therapy: Secondary | ICD-10-CM | POA: Insufficient documentation

## 2015-02-19 DIAGNOSIS — S68111A Complete traumatic metacarpophalangeal amputation of left index finger, initial encounter: Secondary | ICD-10-CM | POA: Insufficient documentation

## 2015-02-19 DIAGNOSIS — Z880 Allergy status to penicillin: Secondary | ICD-10-CM | POA: Diagnosis not present

## 2015-02-19 DIAGNOSIS — Y9289 Other specified places as the place of occurrence of the external cause: Secondary | ICD-10-CM | POA: Diagnosis not present

## 2015-02-19 DIAGNOSIS — J449 Chronic obstructive pulmonary disease, unspecified: Secondary | ICD-10-CM | POA: Diagnosis not present

## 2015-02-19 DIAGNOSIS — S6992XA Unspecified injury of left wrist, hand and finger(s), initial encounter: Secondary | ICD-10-CM | POA: Diagnosis present

## 2015-02-19 DIAGNOSIS — M1990 Unspecified osteoarthritis, unspecified site: Secondary | ICD-10-CM | POA: Insufficient documentation

## 2015-02-19 DIAGNOSIS — Z23 Encounter for immunization: Secondary | ICD-10-CM | POA: Diagnosis not present

## 2015-02-19 DIAGNOSIS — S68119A Complete traumatic metacarpophalangeal amputation of unspecified finger, initial encounter: Secondary | ICD-10-CM

## 2015-02-19 LAB — CBC WITH DIFFERENTIAL/PLATELET
BASOS ABS: 0 10*3/uL (ref 0.0–0.1)
Basophils Relative: 1 % (ref 0–1)
Eosinophils Absolute: 0.1 10*3/uL (ref 0.0–0.7)
Eosinophils Relative: 2 % (ref 0–5)
HEMATOCRIT: 39.6 % (ref 39.0–52.0)
Hemoglobin: 12.7 g/dL — ABNORMAL LOW (ref 13.0–17.0)
Lymphocytes Relative: 12 % (ref 12–46)
Lymphs Abs: 1.1 10*3/uL (ref 0.7–4.0)
MCH: 28.2 pg (ref 26.0–34.0)
MCHC: 32.1 g/dL (ref 30.0–36.0)
MCV: 87.8 fL (ref 78.0–100.0)
MONO ABS: 0.4 10*3/uL (ref 0.1–1.0)
Monocytes Relative: 5 % (ref 3–12)
Neutro Abs: 7 10*3/uL (ref 1.7–7.7)
Neutrophils Relative %: 80 % — ABNORMAL HIGH (ref 43–77)
Platelets: 186 10*3/uL (ref 150–400)
RBC: 4.51 MIL/uL (ref 4.22–5.81)
RDW: 14.5 % (ref 11.5–15.5)
WBC: 8.7 10*3/uL (ref 4.0–10.5)

## 2015-02-19 LAB — BASIC METABOLIC PANEL
Anion gap: 4 — ABNORMAL LOW (ref 5–15)
BUN: 16 mg/dL (ref 6–23)
CALCIUM: 9.4 mg/dL (ref 8.4–10.5)
CO2: 34 mmol/L — ABNORMAL HIGH (ref 19–32)
Chloride: 101 mmol/L (ref 96–112)
Creatinine, Ser: 1.27 mg/dL (ref 0.50–1.35)
GFR calc non Af Amer: 48 mL/min — ABNORMAL LOW (ref >90)
GFR, EST AFRICAN AMERICAN: 55 mL/min — AB (ref >90)
Glucose, Bld: 99 mg/dL (ref 70–99)
Potassium: 4.4 mmol/L (ref 3.5–5.1)
Sodium: 139 mmol/L (ref 135–145)

## 2015-02-19 MED ORDER — LIDOCAINE HCL (PF) 1 % IJ SOLN
5.0000 mL | Freq: Once | INTRAMUSCULAR | Status: AC
  Administered 2015-02-19: 5 mL
  Filled 2015-02-19: qty 5

## 2015-02-19 MED ORDER — LIDOCAINE HCL (PF) 1 % IJ SOLN: 5.0000 mL | Freq: Once | INTRAMUSCULAR | Status: DC

## 2015-02-19 MED ORDER — CLINDAMYCIN PHOSPHATE 300 MG/50ML IV SOLN
300.0000 mg | Freq: Once | INTRAVENOUS | Status: AC
  Administered 2015-02-19: 300 mg via INTRAVENOUS
  Filled 2015-02-19 (×2): qty 50

## 2015-02-19 MED ORDER — ONDANSETRON HCL 4 MG/2ML IJ SOLN
4.0000 mg | Freq: Once | INTRAMUSCULAR | Status: AC
  Administered 2015-02-19: 4 mg via INTRAVENOUS
  Filled 2015-02-19: qty 2

## 2015-02-19 MED ORDER — CLINDAMYCIN HCL 150 MG PO CAPS: 150.0000 mg | ORAL_CAPSULE | Freq: Four times a day (QID) | ORAL | Status: DC

## 2015-02-19 MED ORDER — FENTANYL CITRATE 0.05 MG/ML IJ SOLN
50.0000 ug | Freq: Once | INTRAMUSCULAR | Status: AC
  Administered 2015-02-19: 50 ug via INTRAVENOUS
  Filled 2015-02-19: qty 2

## 2015-02-19 MED ORDER — TETANUS-DIPHTH-ACELL PERTUSSIS 5-2.5-18.5 LF-MCG/0.5 IM SUSP
0.5000 mL | Freq: Once | INTRAMUSCULAR | Status: AC
  Administered 2015-02-19: 0.5 mL via INTRAMUSCULAR

## 2015-02-19 MED ORDER — TETANUS-DIPHTH-ACELL PERTUSSIS 5-2.5-18.5 LF-MCG/0.5 IM SUSP
INTRAMUSCULAR | Status: AC
  Filled 2015-02-19: qty 0.5

## 2015-02-19 MED ORDER — SODIUM CHLORIDE 0.9 % IV SOLN
INTRAVENOUS | Status: DC
  Administered 2015-02-19: 19:00:00 via INTRAVENOUS

## 2015-02-19 NOTE — ED Notes (Signed)
Patient was working with a blower/mower, left index finger tip amputated, bleeding controlled, no sign of fingernail, traumatic amputation

## 2015-02-19 NOTE — ED Provider Notes (Addendum)
Patient status post injury to left index fingertip with amputation distally from a leaf blower. Patient without any other injuries. Patient is right hand dominant this is the left finger. We'll need x-rays will discuss with hand surgery. Patient does have a little bit of an abrasion avulsion to the middle finger of the left hand at the tip but no significant injury there.  Patient has a pacemaker heart is regular no murmurs lungs are clear bilaterally room air sats are 97% or better.  Vanetta MuldersScott Greco Gastelum, MD 02/19/15 1736  Results for orders placed or performed during the hospital encounter of 02/19/15  CBC with Differential/Platelet  Result Value Ref Range   WBC 8.7 4.0 - 10.5 K/uL   RBC 4.51 4.22 - 5.81 MIL/uL   Hemoglobin 12.7 (L) 13.0 - 17.0 g/dL   HCT 04.539.6 40.939.0 - 81.152.0 %   MCV 87.8 78.0 - 100.0 fL   MCH 28.2 26.0 - 34.0 pg   MCHC 32.1 30.0 - 36.0 g/dL   RDW 91.414.5 78.211.5 - 95.615.5 %   Platelets 186 150 - 400 K/uL   Neutrophils Relative % 80 (H) 43 - 77 %   Neutro Abs 7.0 1.7 - 7.7 K/uL   Lymphocytes Relative 12 12 - 46 %   Lymphs Abs 1.1 0.7 - 4.0 K/uL   Monocytes Relative 5 3 - 12 %   Monocytes Absolute 0.4 0.1 - 1.0 K/uL   Eosinophils Relative 2 0 - 5 %   Eosinophils Absolute 0.1 0.0 - 0.7 K/uL   Basophils Relative 1 0 - 1 %   Basophils Absolute 0.0 0.0 - 0.1 K/uL   Dg Finger Index Left  02/19/2015   CLINICAL DATA:  Left index finger caught in leaf blower, bleeding/pain  EXAM: LEFT INDEX FINGER 2+V  COMPARISON:  None.  FINDINGS: Amputation at the level of the base of the second distal phalanx.  Associated soft tissue swelling. No radiopaque foreign body is seen.  Degenerative changes at the PIP joint.  IMPRESSION: Amputation at the level of the base of the second distal phalanx.   Electronically Signed   By: Charline BillsSriyesh  Krishnan M.D.   On: 02/19/2015 18:34    X-ray noted above. Hand surgeon Dr. Orinda KennerWein Golden will be tied up in surgery for several hours. So we went ahead and closed the skin  flap and he'll follow-up in his office first part of the week. Patient tolerated procedure well and was closed loosely. Dressed with bacitracin Xeroform and a finger dressing.  Vanetta MuldersScott Friend Dorfman, MD 02/19/15 1929   Medical screening examination/treatment/procedure(s) were conducted as a shared visit with non-physician practitioner(s) and myself.  I personally evaluated the patient during the encounter.   EKG Interpretation   Date/Time:  Saturday February 19 2015 17:45:23 EST Ventricular Rate:  71 PR Interval:    QRS Duration: 144 QT Interval:  408 QTC Calculation: 443 R Axis:   46 Text Interpretation:  Demand pacemaker; interpretation is based on  intrinsic rhythm Atrial fibrillation with premature ventricular or  aberrantly conducted complexes Left ventricular hypertrophy with QRS  widening T wave abnormality, consider inferior ischemia or digitalis  effect T wave abnormality, consider anterolateral ischemia or digitalis  effect Abnormal ECG ED PHYSICIAN INTERPRETATION AVAILABLE IN [facility] Confirmed by TEST, Record ([id number]) on 02/21/2015 6:56:34 AM       Vanetta MuldersScott Myquan Schaumburg, MD 03/03/15 1545
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
## 2015-02-19 NOTE — Progress Notes (Signed)
Orthopedic Tech Progress Note Patient Details:  Nelwyn SalisburyWilliam H Beal 1922-01-09 [id number] Applied static aluminum/foam finger splint to Lt. 2nd finger.  Movement and sensation intact before and after splinting.  Unable to assess capillary refill due to surgical dressing on finger. Ortho Devices Type of Ortho Device: Finger splint Ortho Device/Splint Location: Lt. 2nd finger. Ortho Device/Splint Interventions: Application   Lesle ChrisGilliland, Rayelynn Loyal L 02/19/2015, 7:51 PM

## 2015-02-19 NOTE — ED Notes (Signed)
Waiting for ortho to splint finger then patient will be discharged.

## 2015-02-19 NOTE — ED Notes (Signed)
[facility] L index finger. Pt sts that he stuck in hand in the fan of a leaf blower. Finger is bandage on arrival to ER

## 2015-02-19 NOTE — Discharge Instructions (Signed)
Fingertip Injuries and Amputations °Fingertip injuries are common and often get injured because they are last to escape when pulling your hand out of harm's way. You have amputated (cut off) part of your finger. How this turns out depends largely on how much was amputated. If just the tip is amputated, often the end of the finger will grow back and the finger may return to much the same as it was before the injury.  °If more of the finger is missing, your caregiver has done the best with the tissue remaining to allow you to keep as much finger as is possible. Your caregiver after checking your injury has tried to leave you with a painless fingertip that has durable, feeling skin. If possible, your caregiver has tried to maintain the finger's length and appearance and preserve its fingernail.  °Please read the instructions outlined below and refer to this sheet in the next few weeks. These instructions provide you with general information on caring for yourself. Your caregiver may also give you specific instructions. While your treatment has been done according to the most current medical practices available, unavoidable complications occasionally occur. If you have any problems or questions after discharge, please call your caregiver. °HOME CARE INSTRUCTIONS  °· You may resume normal diet and activities as directed or allowed. °· Keep your hand elevated above the level of your heart. This helps decrease pain and swelling. °· Keep ice packs (or a bag of ice wrapped in a towel) on the injured area for 15-20 minutes, 03-04 times per day, for the first two days. °· Change dressings if necessary or as directed. °· Clean the wound daily or as directed. °· Only take over-the-counter or prescription medicines for pain, discomfort, or fever as directed by your caregiver. °· Keep appointments as directed. °SEEK IMMEDIATE MEDICAL CARE IF: °· You develop redness, swelling, numbness or increasing pain in the wound. °· There is  pus coming from the wound. °· You develop an unexplained oral temperature above 102° F (38.9° C) or as your caregiver suggests. °· There is a foul (bad) smell coming from the wound or dressing. °· There is a breaking open of the wound (edges not staying together) after sutures or staples have been removed. °MAKE SURE YOU:  °· Understand these instructions. °· Will watch your condition. °· Will get help right away if you are not doing well or get worse. °Document Released: 10/31/2005 Document Revised: 03/03/2012 Document Reviewed: 09/29/2008 °ExitCare® Patient Information ©2015 [employer]. This information is not intended to replace advice given to you by your health care provider. Make sure you discuss any questions you have with your health care provider. ° °

## 2015-02-19 NOTE — ED Notes (Signed)
Discharge instructions given to patient and patients wife:  Call Dr. Mina MarbleWeingold Monday for Monday/tuesday appt; do not change dressing per PA.   Both verbalized understanding.
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
## 2015-03-10 ENCOUNTER — Ambulatory Visit (INDEPENDENT_AMBULATORY_CARE_PROVIDER_SITE_OTHER): Payer: Medicare HMO | Admitting: *Deleted

## 2015-03-10 DIAGNOSIS — I495 Sick sinus syndrome: Secondary | ICD-10-CM

## 2015-03-10 LAB — MDC_IDC_ENUM_SESS_TYPE_INCLINIC
Battery Impedance: 208 Ohm
Battery Remaining Longevity: 112 mo
Battery Voltage: 2.79 V
Brady Statistic AP VP Percent: 21 %
Brady Statistic AP VS Percent: 10 %
Brady Statistic AS VP Percent: 3 %
Date Time Interrogation Session: 20160317161928
Lead Channel Pacing Threshold Amplitude: 0.75 V
Lead Channel Sensing Intrinsic Amplitude: 2 mV
Lead Channel Sensing Intrinsic Amplitude: 4 mV
Lead Channel Setting Pacing Amplitude: 2.5 V
Lead Channel Setting Pacing Pulse Width: 0.4 ms
MDC IDC MSMT LEADCHNL RA IMPEDANCE VALUE: 538 Ohm
MDC IDC MSMT LEADCHNL RV IMPEDANCE VALUE: 475 Ohm
MDC IDC MSMT LEADCHNL RV PACING THRESHOLD PULSEWIDTH: 0.4 ms
MDC IDC SET LEADCHNL RA PACING AMPLITUDE: 2 V
MDC IDC SET LEADCHNL RV SENSING SENSITIVITY: 2 mV
MDC IDC STAT BRADY AS VS PERCENT: 66 %

## 2015-03-10 NOTE — Progress Notes (Signed)
Pacemaker check in clinic. Normal device function. Thresholds, sensing, impedances consistent with previous measurements. Device programmed to maximize longevity. A-fib with slow ventricular response, - anticoagulation due to hx of subdural.  43 high ventricular rates noted 3-36 seconds. Device programmed at appropriate safety margins. Histogram distribution appropriate for patient activity level. Device programmed to optimize intrinsic conduction. Estimated longevity 9.5  years. Patient enrolled in remote follow-up/TTM's with [employer]. Plan to follow every 3 months remotely and see annually in office. Patient education completed.  ROV in July with Dr. Graciela HusbandsKlein.  We will start remote checks at that visit.

## 2015-03-24 ENCOUNTER — Encounter: Payer: Self-pay | Admitting: Internal Medicine

## 2015-04-26 ENCOUNTER — Other Ambulatory Visit: Payer: Self-pay | Admitting: Internal Medicine

## 2015-04-26 DIAGNOSIS — R918 Other nonspecific abnormal finding of lung field: Secondary | ICD-10-CM

## 2015-05-03 ENCOUNTER — Other Ambulatory Visit: Payer: Medicare HMO

## 2015-07-19 ENCOUNTER — Encounter: Payer: Self-pay | Admitting: *Deleted

## 2015-07-25 ENCOUNTER — Encounter: Payer: Self-pay | Admitting: Internal Medicine

## 2015-08-11 ENCOUNTER — Encounter: Payer: Self-pay | Admitting: Internal Medicine

## 2015-08-11 ENCOUNTER — Ambulatory Visit (INDEPENDENT_AMBULATORY_CARE_PROVIDER_SITE_OTHER): Payer: Medicare HMO | Admitting: Internal Medicine

## 2015-08-11 VITALS — BP 126/84 | HR 68 | Ht 68.0 in | Wt 125.0 lb

## 2015-08-11 DIAGNOSIS — I495 Sick sinus syndrome: Secondary | ICD-10-CM

## 2015-08-11 DIAGNOSIS — I484 Atypical atrial flutter: Secondary | ICD-10-CM | POA: Diagnosis not present

## 2015-08-11 DIAGNOSIS — Z45018 Encounter for adjustment and management of other part of cardiac pacemaker: Secondary | ICD-10-CM

## 2015-08-11 DIAGNOSIS — Z950 Presence of cardiac pacemaker: Secondary | ICD-10-CM

## 2015-08-11 LAB — CUP PACEART INCLINIC DEVICE CHECK
Battery Impedance: 232 Ohm
Battery Remaining Longevity: 107 mo
Battery Voltage: 2.79 V
Brady Statistic AP VP Percent: 30 %
Brady Statistic AS VP Percent: 6 %
Date Time Interrogation Session: 20160818161033
Lead Channel Impedance Value: 530 Ohm
Lead Channel Sensing Intrinsic Amplitude: 4 mV
Lead Channel Setting Pacing Amplitude: 2 V
Lead Channel Setting Pacing Amplitude: 2.5 V
Lead Channel Setting Pacing Pulse Width: 0.4 ms
Lead Channel Setting Sensing Sensitivity: 2 mV
MDC IDC MSMT LEADCHNL RV IMPEDANCE VALUE: 470 Ohm
MDC IDC MSMT LEADCHNL RV PACING THRESHOLD AMPLITUDE: 0.75 V
MDC IDC MSMT LEADCHNL RV PACING THRESHOLD PULSEWIDTH: 0.4 ms
MDC IDC MSMT LEADCHNL RV SENSING INTR AMPL: 2.8 mV
MDC IDC STAT BRADY AP VS PERCENT: 11 %
MDC IDC STAT BRADY AS VS PERCENT: 52 %

## 2015-08-11 NOTE — Patient Instructions (Signed)
Medication Instructions: - no changes  Labwork: - none  Procedures/Testing: - none  Follow-Up: - Your physician wants you to follow-up in: 6 months with the Device Clinc & 1 year with Gypsy Balsam- NP for Dr. Graciela Husbands. You will receive a reminder letter in the mail two months in advance. If you don't receive a letter, please call our office to schedule the follow-up appointment.  Any Additional Special Instructions Will Be Listed Below (If Applicable). - none
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
## 2015-11-08 ENCOUNTER — Other Ambulatory Visit: Payer: Self-pay | Admitting: Physician Assistant

## 2016-02-27 ENCOUNTER — Other Ambulatory Visit: Payer: Self-pay | Admitting: Cardiology

## 2016-04-17 ENCOUNTER — Other Ambulatory Visit: Payer: Self-pay | Admitting: Cardiology

## 2017-01-07 ENCOUNTER — Encounter (HOSPITAL_COMMUNITY): Payer: Self-pay | Admitting: *Deleted

## 2017-01-07 ENCOUNTER — Ambulatory Visit (INDEPENDENT_AMBULATORY_CARE_PROVIDER_SITE_OTHER): Payer: Medicare HMO

## 2017-01-07 ENCOUNTER — Ambulatory Visit (HOSPITAL_COMMUNITY)
Admission: EM | Admit: 2017-01-07 | Discharge: 2017-01-07 | Disposition: A | Discharge status: Home / Self Care | Payer: Medicare HMO | Attending: Family Medicine | Admitting: Family Medicine

## 2017-01-07 DIAGNOSIS — R52 Pain, unspecified: Secondary | ICD-10-CM

## 2017-01-07 DIAGNOSIS — M25551 Pain in right hip: Secondary | ICD-10-CM

## 2017-01-07 DIAGNOSIS — M25552 Pain in left hip: Principal | ICD-10-CM

## 2017-01-07 NOTE — ED Triage Notes (Signed)
Pain     l  Hip     And  l    Left  Upper           Pt   Possibly  Fell                Pt       Walks  With  [place]       With  Difficulty

## 2017-01-07 NOTE — Discharge Instructions (Signed)
Heat,tylenol or advil for pain, activity as tolerated,
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
## 2017-05-24 DEATH — deceased
